# Patient Record
Sex: Female | Born: 1965 | Hispanic: No | Smoking: Current every day smoker
Health system: Southern US, Community
[De-identification: ages and names within clinical notes are randomized; demographics above are authoritative.]

## PROBLEM LIST (undated history)

## (undated) DIAGNOSIS — F419 Anxiety disorder, unspecified: Secondary | ICD-10-CM

## (undated) DIAGNOSIS — F32A Depression, unspecified: Secondary | ICD-10-CM

## (undated) DIAGNOSIS — E785 Hyperlipidemia, unspecified: Secondary | ICD-10-CM

## (undated) DIAGNOSIS — F329 Major depressive disorder, single episode, unspecified: Secondary | ICD-10-CM

## (undated) HISTORY — DX: Depression, unspecified: F32.A

## (undated) HISTORY — PX: OTHER SURGICAL HISTORY: SHX169

## (undated) HISTORY — DX: Major depressive disorder, single episode, unspecified: F32.9

## (undated) HISTORY — DX: Anxiety disorder, unspecified: F41.9

## (undated) HISTORY — PX: ABDOMINAL HYSTERECTOMY: SHX81

## (undated) HISTORY — DX: Hyperlipidemia, unspecified: E78.5

---

## 2003-05-19 ENCOUNTER — Other Ambulatory Visit: Admission: RE | Admit: 2003-05-19 | Discharge: 2003-05-19 | Payer: Self-pay | Admitting: Family Medicine

## 2005-05-11 ENCOUNTER — Other Ambulatory Visit: Admission: RE | Admit: 2005-05-11 | Discharge: 2005-05-11 | Payer: Self-pay | Admitting: Family Medicine

## 2007-05-07 ENCOUNTER — Other Ambulatory Visit: Admission: RE | Admit: 2007-05-07 | Discharge: 2007-05-07 | Payer: Self-pay | Admitting: Family Medicine

## 2009-04-13 ENCOUNTER — Encounter: Payer: Self-pay | Admitting: Cardiovascular Disease

## 2009-04-13 ENCOUNTER — Ambulatory Visit: Payer: Self-pay | Admitting: Cardiovascular Disease

## 2009-04-13 DIAGNOSIS — D72829 Elevated white blood cell count, unspecified: Secondary | ICD-10-CM | POA: Insufficient documentation

## 2009-04-13 DIAGNOSIS — R0789 Other chest pain: Secondary | ICD-10-CM

## 2009-04-13 DIAGNOSIS — F17201 Nicotine dependence, unspecified, in remission: Secondary | ICD-10-CM

## 2009-04-13 DIAGNOSIS — R55 Syncope and collapse: Secondary | ICD-10-CM | POA: Insufficient documentation

## 2012-02-06 ENCOUNTER — Other Ambulatory Visit: Payer: Self-pay | Admitting: Family Medicine

## 2012-02-06 DIAGNOSIS — N644 Mastodynia: Secondary | ICD-10-CM

## 2013-05-29 ENCOUNTER — Telehealth: Payer: Self-pay | Admitting: Family Medicine

## 2013-05-29 NOTE — Telephone Encounter (Signed)
appt given with Sarah Mcgee for Monday at 9:00

## 2013-06-02 ENCOUNTER — Ambulatory Visit (INDEPENDENT_AMBULATORY_CARE_PROVIDER_SITE_OTHER): Payer: Managed Care, Other (non HMO) | Admitting: General Practice

## 2013-06-02 ENCOUNTER — Encounter: Payer: Self-pay | Admitting: General Practice

## 2013-06-02 VITALS — BP 123/85 | HR 98 | Temp 98.7°F | Ht 65.0 in | Wt 163.0 lb

## 2013-06-02 DIAGNOSIS — L089 Local infection of the skin and subcutaneous tissue, unspecified: Secondary | ICD-10-CM

## 2013-06-02 DIAGNOSIS — B999 Unspecified infectious disease: Secondary | ICD-10-CM

## 2013-06-02 DIAGNOSIS — F329 Major depressive disorder, single episode, unspecified: Secondary | ICD-10-CM

## 2013-06-02 DIAGNOSIS — F32A Depression, unspecified: Secondary | ICD-10-CM

## 2013-06-02 DIAGNOSIS — F411 Generalized anxiety disorder: Secondary | ICD-10-CM

## 2013-06-02 MED ORDER — ALPRAZOLAM 0.25 MG PO TABS: 0.2500 mg | ORAL_TABLET | Freq: Two times a day (BID) | ORAL | Status: DC | PRN

## 2013-06-02 MED ORDER — CITALOPRAM HYDROBROMIDE 40 MG PO TABS: 40.0000 mg | ORAL_TABLET | Freq: Every day | ORAL | Status: DC

## 2013-06-02 MED ORDER — SULFAMETHOXAZOLE-TMP DS 800-160 MG PO TABS: 1.0000 | ORAL_TABLET | Freq: Two times a day (BID) | ORAL | Status: DC

## 2013-06-02 NOTE — Progress Notes (Signed)
  Subjective:    Patient ID: Sarah Mcgee, female    DOB: 11/29/1966, 47 y.o.   MRN: [id number]  HPI Presents today for medication refill. Reports she stopped taking medications on her own. Reports she has not taken in a few months. Reports she has been more irritable and having mood swings. Reports having no motivation at times.  Patient feels she needs to go back on the medications. Reports stress and anxiety is coming from work. Reports her shift was changed at work, she went from working third to second shift, which caused major adjustments. Reports having a hysterectomy in the 1990's.     Review of Systems  Constitutional: Negative for fever and chills.  Respiratory: Negative for chest tightness, shortness of breath and wheezing.   Cardiovascular: Negative for chest pain and palpitations.  Gastrointestinal: Negative for abdominal pain.  Genitourinary: Negative for difficulty urinating.  Neurological: Negative for dizziness, weakness, numbness and headaches.  Psychiatric/Behavioral: Positive for sleep disturbance. Negative for suicidal ideas. The patient is nervous/anxious.        Objective:   Physical Exam  Constitutional: She is oriented to person, place, and time. She appears well-developed and well-nourished.  HENT:  Head: Normocephalic and atraumatic.  Right Ear: External ear normal.  Left Ear: External ear normal.  Cardiovascular: Normal rate, regular rhythm and normal heart sounds.   Pulmonary/Chest: Effort normal and breath sounds normal.  Genitourinary:  Small maculopapular erythematous area (size of pencil eraser) noted to left labia majora. Negative drainage noted.  Neurological: She is alert and oriented to person, place, and time.  Skin: Skin is warm and dry.  Psychiatric: She has a normal mood and affect.          Assessment & Plan:  1. Depression - citalopram (CELEXA) 40 MG tablet; Take 1 tablet (40 mg total) by mouth daily.  Dispense: 30 tablet; Refill:  0 -RTO in 6 weeks for reassessment  2. Generalized anxiety disorder - ALPRAZolam (XANAX) 0.25 MG tablet; Take 1 tablet (0.25 mg total) by mouth 2 (two) times daily as needed for anxiety.  Dispense: 60 tablet; Refill: 0 -reduce stressors and determine areas of anxiety   3. Soft tissue infection - sulfamethoxazole-trimethoprim (BACTRIM DS) 800-160 MG per tablet; Take 1 tablet by mouth 2 (two) times daily.  Dispense: 20 tablet; Refill: 0 -keep area clean and dry -proper perineal care -RTO if symptoms worsen -Patient verbalized understanding -Coralie Keens, FNP-C

## 2013-06-02 NOTE — Patient Instructions (Addendum)
Depression, Adult Depression refers to feeling sad, low, down in the dumps, blue, gloomy, or empty. In general, there are two kinds of depression: 1. Depression that we all experience from time to time because of upsetting life experiences, including the loss of a job or the ending of a relationship (normal sadness or normal grief). This kind of depression is considered normal, is short lived, and resolves within a few days to 2 weeks. (Depression experienced after the loss of a loved one is called bereavement. Bereavement often lasts longer than 2 weeks but normally gets better with time.) 2. Clinical depression, which lasts longer than normal sadness or normal grief or interferes with your ability to function at home, at work, and in school. It also interferes with your personal relationships. It affects almost every aspect of your life. Clinical depression is an illness. Symptoms of depression also can be caused by conditions other than normal sadness and grief or clinical depression. Examples of these conditions are listed as follows:  Physical illness Some physical illnesses, including underactive thyroid gland (hypothyroidism), severe anemia, specific types of cancer, diabetes, uncontrolled seizures, heart and lung problems, strokes, and chronic pain are commonly associated with symptoms of depression.  Side effects of some prescription medicine In some people, certain types of prescription medicine can cause symptoms of depression.  Substance abuse Abuse of alcohol and illicit drugs can cause symptoms of depression. SYMPTOMS Symptoms of normal sadness and normal grief include the following:  Feeling sad or crying for short periods of time.  Not caring about anything (apathy).  Difficulty sleeping or sleeping too much.  No longer able to enjoy the things you used to enjoy.  Desire to be by oneself all the time (social isolation).  Lack of energy or motivation.  Difficulty  concentrating or remembering.  Change in appetite or weight.  Restlessness or agitation. Symptoms of clinical depression include the same symptoms of normal sadness or normal grief and also the following symptoms:  Feeling sad or crying all the time.  Feelings of guilt or worthlessness.  Feelings of hopelessness or helplessness.  Thoughts of suicide or the desire to harm yourself (suicidal ideation).  Loss of touch with reality (psychotic symptoms). Seeing or hearing things that are not real (hallucinations) or having false beliefs about your life or the people around you (delusions and paranoia). DIAGNOSIS  The diagnosis of clinical depression usually is based on the severity and duration of the symptoms. Your caregiver also will ask you questions about your medical history and substance use to find out if physical illness, use of prescription medicine, or substance abuse is causing your depression. Your caregiver also may order blood tests. TREATMENT  Typically, normal sadness and normal grief do not require treatment. However, sometimes antidepressant medicine is prescribed for bereavement to ease the depressive symptoms until they resolve. The treatment for clinical depression depends on the severity of your symptoms but typically includes antidepressant medicine, counseling with a mental health professional, or a combination of both. Your caregiver will help to determine what treatment is best for you. Depression caused by physical illness usually goes away with appropriate medical treatment of the illness. If prescription medicine is causing depression, talk with your caregiver about stopping the medicine, decreasing the dose, or substituting another medicine. Depression caused by abuse of alcohol or illicit drugs abuse goes away with abstinence from these substances. Some adults need professional help in order to stop drinking or using drugs. SEEK IMMEDIATE CARE IF:  You have   thoughts  about hurting yourself or others.  You lose touch with reality (have psychotic symptoms).  You are taking medicine for depression and have a serious side effect. FOR MORE INFORMATION National Alliance on Mental Illness: www.nami.[employer]: http://www.maynard.net/ Document Released: 12/08/2000 Document Revised: 06/11/2012 Document Reviewed: 03/11/2012 [facility] Patient Information 2014 [place], [place]. Anxiety and Panic Attacks Your caregiver has informed you that you are having an anxiety or panic attack. There may be many forms of this. Most of the time these attacks come suddenly and without warning. They come at any time of day, including periods of sleep, and at any time of life. They may be strong and unexplained. Although panic attacks are very scary, they are physically harmless. Sometimes the cause of your anxiety is not known. Anxiety is a protective mechanism of the body in its fight or flight mechanism. Most of these perceived danger situations are actually nonphysical situations (such as anxiety over losing a job). CAUSES  The causes of an anxiety or panic attack are many. Panic attacks may occur in otherwise healthy people given a certain set of circumstances. There may be a genetic cause for panic attacks. Some medications may also have anxiety as a side effect. SYMPTOMS  Some of the most common feelings are:  Intense terror.  Dizziness, feeling faint.  Hot and cold flashes.  Fear of going crazy.  Feelings that nothing is real.  Sweating.  Shaking.  Chest pain or a fast heartbeat (palpitations).  Smothering, choking sensations.  Feelings of impending doom and that death is near.  Tingling of extremities, this may be from over-breathing.  Altered reality (derealization).  Being detached from yourself (depersonalization). Several symptoms can be present to make up anxiety or panic attacks. DIAGNOSIS  The evaluation by your caregiver will  depend on the type of symptoms you are experiencing. The diagnosis of anxiety or panic attack is made when no physical illness can be determined to be a cause of the symptoms. TREATMENT  Treatment to prevent anxiety and panic attacks may include:  Avoidance of circumstances that cause anxiety.  Reassurance and relaxation.  Regular exercise.  Relaxation therapies, such as yoga.  Psychotherapy with a psychiatrist or therapist.  Avoidance of caffeine, alcohol and illegal drugs.  Prescribed medication. SEEK IMMEDIATE MEDICAL CARE IF:   You experience panic attack symptoms that are different than your usual symptoms.  You have any worsening or concerning symptoms. Document Released: 12/11/2005 Document Revised: 0 Skin Infections A skin infection usually develops as a result of disruption of the skin barrier.  CAUSES  A skin infection might occur following:  Trauma or an injury to the skin such as a cut or insect sting.  Inflammation (as in eczema).  Breaks in the skin between the toes (as in athlete's foot).  Swelling (edema). SYMPTOMS  The legs are the most common site affected. Usually there is:  Redness.  Swelling.  Pain.  There may be red streaks in the area of the infection. TREATMENT   Minor skin infections may be treated with topical antibiotics, but if the skin infection is severe, hospital care and intravenous (IV) antibiotic treatment may be needed.  Most often skin infections can be treated with oral antibiotic medicine as well as proper rest and elevation of the affected area until the infection improves.  If you are prescribed oral antibiotics, it is important to take them as directed and to take all the pills even if you feel better before you  have finished all of the medicine.  You may apply warm compresses to the area for 20-30 minutes 4 times daily. You might need a tetanus shot now if:  You have no idea when you had the last one.  You have never  had a tetanus shot before.  Your wound had dirt in it. If you need a tetanus shot and you decide not to get one, there is a rare chance of getting tetanus. Sickness from tetanus can be serious. If you get a tetanus shot, your arm may swell and become red and warm at the shot site. This is common and not a problem. SEEK MEDICAL CARE IF:  The pain and swelling from your infection do not improve within 2 days.  SEEK IMMEDIATE MEDICAL CARE IF:  You develop a fever, chills, or other serious problems.  Document Released: 01/18/2005 Document Revised: 03/04/2012 Document Reviewed: 11/30/2008 [facility] Patient Information 2014 [place], [place]. 03/04/2012 Document Reviewed: 04/14/2010 [employer] Patient Information 2014 [place], [place].

## 2013-06-26 ENCOUNTER — Encounter: Payer: Self-pay | Admitting: General Practice

## 2013-06-26 ENCOUNTER — Ambulatory Visit (INDEPENDENT_AMBULATORY_CARE_PROVIDER_SITE_OTHER): Payer: Managed Care, Other (non HMO) | Admitting: General Practice

## 2013-06-26 VITALS — BP 107/75 | HR 90 | Temp 97.8°F | Ht 65.0 in | Wt 152.0 lb

## 2013-06-26 DIAGNOSIS — Z0000 Encounter for general adult medical examination without abnormal findings: Secondary | ICD-10-CM

## 2013-06-26 DIAGNOSIS — Z124 Encounter for screening for malignant neoplasm of cervix: Secondary | ICD-10-CM

## 2013-06-26 DIAGNOSIS — F329 Major depressive disorder, single episode, unspecified: Secondary | ICD-10-CM

## 2013-06-26 DIAGNOSIS — F3289 Other specified depressive episodes: Secondary | ICD-10-CM

## 2013-06-26 DIAGNOSIS — F411 Generalized anxiety disorder: Secondary | ICD-10-CM

## 2013-06-26 DIAGNOSIS — F32A Depression, unspecified: Secondary | ICD-10-CM

## 2013-06-26 LAB — POCT CBC
Granulocyte percent: 71.9 %G (ref 37–80)
Lymph, poc: 3.2 (ref 0.6–3.4)
MPV: 8.6 fL (ref 0–99.8)
POC Granulocyte: 9.2 — AB (ref 2–6.9)
POC LYMPH PERCENT: 24.7 %L (ref 10–50)
Platelet Count, POC: 322 10*3/uL (ref 142–424)
RBC: 5.5 M/uL — AB (ref 4.04–5.48)

## 2013-06-26 LAB — POCT URINALYSIS DIPSTICK
Glucose, UA: NEGATIVE
Leukocytes, UA: NEGATIVE
Nitrite, UA: NEGATIVE
Urobilinogen, UA: NEGATIVE

## 2013-06-26 LAB — POCT UA - MICROSCOPIC ONLY: WBC, Ur, HPF, POC: NEGATIVE

## 2013-06-26 MED ORDER — CITALOPRAM HYDROBROMIDE 40 MG PO TABS: 40.0000 mg | ORAL_TABLET | Freq: Every day | ORAL | Status: DC

## 2013-06-26 MED ORDER — ALPRAZOLAM 0.5 MG PO TABS: 0.5000 mg | ORAL_TABLET | Freq: Two times a day (BID) | ORAL | Status: DC | PRN

## 2013-06-26 NOTE — Progress Notes (Signed)
  Subjective:    Patient ID: Sarah Mcgee, female    DOB: September 27, 1966, 47 y.o.   MRN: [id number]  HPI Presents today for annual physical. She is has a history of depression and anxiety. She reports medications are effective, but seems more anxious since xanax decreased. She reports eating a healthy and regular exercise. She weight loss with walking, decrease carbs, no alcohol. She also reports smoking cessation for past month.     Review of Systems  Constitutional: Negative for fever and chills.  Respiratory: Negative for chest tightness and shortness of breath.   Cardiovascular: Negative for chest pain and palpitations.  Gastrointestinal: Negative for vomiting, abdominal pain, diarrhea, constipation and blood in stool.  Genitourinary: Negative for hematuria, vaginal bleeding and difficulty urinating.  Musculoskeletal: Negative for back pain.  Neurological: Negative for dizziness, weakness and headaches.       Objective:   Physical Exam  Constitutional: She is oriented to person, place, and time. She appears well-developed and well-nourished.  HENT:  Head: Normocephalic and atraumatic.  Right Ear: External ear normal.  Left Ear: External ear normal.  Mouth/Throat: Oropharynx is clear and moist.  Eyes: EOM are normal.  Neck: Normal range of motion. Neck supple. No thyromegaly present.  Cardiovascular: Normal rate, regular rhythm and normal heart sounds.   Pulmonary/Chest: Effort normal and breath sounds normal. Right breast exhibits no inverted nipple, no mass, no nipple discharge, no skin change and no tenderness. Left breast exhibits no inverted nipple, no mass, no nipple discharge, no skin change and no tenderness. Breasts are symmetrical.  Abdominal: Soft. Bowel sounds are normal. She exhibits no distension. There is no tenderness. Hernia confirmed negative in the right inguinal area and confirmed negative in the left inguinal area.  Genitourinary: No breast swelling, tenderness,  discharge or bleeding. Pelvic exam was performed with patient supine. There is no rash, tenderness, lesion or injury on the right labia. There is no rash, tenderness, lesion or injury on the left labia. Right adnexum displays no mass, no tenderness and no fullness. Left adnexum displays no mass, no tenderness and no fullness. No erythema, tenderness or bleeding around the vagina. No signs of injury around the vagina. No vaginal discharge found.  Vaginal cuff intact, hysterectomy noted  Neurological: She is alert and oriented to person, place, and time.  Skin: Skin is warm and dry.  Psychiatric: She has a normal mood and affect.          Assessment & Plan:  1. PE (physical exam), annual - POCT urinalysis dipstick - POCT UA - Microscopic Only - POCT CBC - COMPLETE METABOLIC PANEL WITH GFR - Pap IG, CT/NG w/ reflex HPV when ASC-U  2. Generalized anxiety disorder - ALPRAZolam (XANAX) 0.5 MG tablet; Take 1 tablet (0.5 mg total) by mouth 2 (two) times daily as needed for anxiety.  Dispense: 60 tablet; Refill: 0  3. Depression - citalopram (CELEXA) 40 MG tablet; Take 1 tablet (40 mg total) by mouth daily.  Dispense: 30 tablet; Refill: 3 Continue all current medications Labs pending F/u in 3 months or sooner if symptoms develop Continue regular exercise and healthy eating habits  Continue smoking cessation Patient verbalized understanding Coralie Keens, FNP-C

## 2013-06-26 NOTE — Patient Instructions (Addendum)

## 2013-06-27 LAB — COMPLETE METABOLIC PANEL WITH GFR
ALT: 27 U/L (ref 0–35)
AST: 23 U/L (ref 0–37)
Albumin: 4.4 g/dL (ref 3.5–5.2)
Alkaline Phosphatase: 52 U/L (ref 39–117)
Calcium: 9.9 mg/dL (ref 8.4–10.5)
Chloride: 101 mEq/L (ref 96–112)
Potassium: 4.4 mEq/L (ref 3.5–5.3)
Sodium: 137 mEq/L (ref 135–145)

## 2013-07-01 LAB — PAP IG, CT-NG, RFX HPV ASCU
Chlamydia Probe Amp: NEGATIVE
GC Probe Amp: NEGATIVE

## 2013-08-01 ENCOUNTER — Ambulatory Visit (INDEPENDENT_AMBULATORY_CARE_PROVIDER_SITE_OTHER): Payer: Managed Care, Other (non HMO) | Admitting: Family Medicine

## 2013-08-01 ENCOUNTER — Encounter: Payer: Self-pay | Admitting: Family Medicine

## 2013-08-01 VITALS — BP 115/74 | HR 86 | Temp 98.1°F | Ht 65.0 in | Wt 158.0 lb

## 2013-08-01 DIAGNOSIS — R1011 Right upper quadrant pain: Secondary | ICD-10-CM

## 2013-08-01 LAB — POCT CBC
Granulocyte percent: 78.9 %G (ref 37–80)
HCT, POC: 46.3 % (ref 37.7–47.9)
Hemoglobin: 15.7 g/dL (ref 12.2–16.2)
Lymph, poc: 3 (ref 0.6–3.4)
MCH, POC: 30.9 pg (ref 27–31.2)
MCHC: 34 g/dL (ref 31.8–35.4)
MCV: 90.8 fL (ref 80–97)
MPV: 7.9 fL (ref 0–99.8)
POC Granulocyte: 13.4 — AB (ref 2–6.9)
POC LYMPH PERCENT: 17.9 %L (ref 10–50)
Platelet Count, POC: 290 10*3/uL (ref 142–424)
RBC: 5.1 M/uL (ref 4.04–5.48)
RDW, POC: 15 %
WBC: 17 10*3/uL — AB (ref 4.6–10.2)

## 2013-08-01 NOTE — Progress Notes (Signed)
Subjective:    Patient ID: Sarah Mcgee, female    DOB: Sep 28, 1966, 47 y.o.   MRN: [id number]  HPI This 47 y.o. female presents for evaluation of abdominal pain for the last few days. She was seen on 07/30/13 at [facility] ED in [place] and underwent CT of abdomen and  Labs and was dx with colitis.  She was administered iv pain meds and given a rx of lortab #15 And she received zofran and IV fluids.  She states she was told her CT was negative and she  Had acute gastroenteritis.  She has been having colicky right upper quadrant abdominal pain that radiates To her back for the last few weeks and it is worsening.  She states she is having fever but is not taking Her temperature.  She states when she eats she becomes nauseated and has the abdominal pain. She has been nauseated and has been experiencing some diarrhea.  She also is c/o right otalgia and decreased Hearing.   Review of Systems C/o right upper quadrant abdominal pain, right back pain, nausea, diarrhea, fever, and malaise. C/o right otalgia   No chest pain, SOB, HA, dizziness, vision change,  constipation, dysuria, urinary urgency or frequency, myalgias, arthralgias or rash.  Objective:   Physical Exam  Vital signs noted  Well developed well nourished female in pain.  HEENT - Head atraumatic Normocephalic                Eyes - PERRLA, Conjuctiva - clear Sclera- Clear EOMI                Ears - EAC's Wnl TM's AD dull and injected and AS wnl Gross Hearing decreased right ear.                Nose - Nares patent                 Throat - oropharanx wnl Respiratory - Lungs CTA bilateral Cardiac - RRR S1 and S2 without murmur GI - Abdomen tender right upper quadrant, epigastric region, and positive murphy's,  bowel sounds active x 4 Extremities - No edema. Neuro - Grossly intact.  Results for orders placed in visit on 08/01/13  POCT CBC      Result Value Range   WBC 17.0 (*) 4.6 - 10.2 K/uL   Lymph, poc 3.0  0.6 - 3.4   POC LYMPH PERCENT 17.9  10 - 50 %L   MID (cbc)    0 - 0.9   POC MID %    0 - 12 %M   POC Granulocyte 13.4 (*) 2 - 6.9   Granulocyte percent 78.9  37 - 80 %G   RBC 5.1  4.04 - 5.48 M/uL   Hemoglobin 15.7  12.2 - 16.2 g/dL   HCT, POC 40.9  81.1 - 47.9 %   MCV 90.8  80 - 97 fL   MCH, POC 30.9  27 - 31.2 pg   MCHC 34.0  31.8 - 35.4 g/dL   RDW, POC 91.4     Platelet Count, POC 290.0  142 - 424 K/uL   MPV 7.9  0 - 99.8 fL      Assessment & Plan:  Abdominal pain, right upper quadrant - Plan: POCT CBC, Sedimentation rate, Lipase, Amylase, CMP14+EGFR, US Abdomen Limited RUQ Her wbc count was 17 and has increased since her visit 07/30/13 where it was 15.7 so patient was advised that she should go on to the Emergency room and  be seen for acute abdomen.  She wants to go back to [facility] ED and they are called and given history.

## 2013-08-01 NOTE — Patient Instructions (Addendum)

## 2013-08-03 LAB — CMP14+EGFR
ALT: 20 IU/L (ref 0–32)
AST: 24 IU/L (ref 0–40)
Albumin/Globulin Ratio: 2.1 (ref 1.1–2.5)
Albumin: 4.1 g/dL (ref 3.5–5.5)
Alkaline Phosphatase: 64 IU/L (ref 39–117)
BUN/Creatinine Ratio: 16 (ref 9–23)
BUN: 9 mg/dL (ref 6–24)
CO2: 23 mmol/L (ref 18–29)
Calcium: 9 mg/dL (ref 8.7–10.2)
Chloride: 100 mmol/L (ref 97–108)
Creatinine, Ser: 0.56 mg/dL — ABNORMAL LOW (ref 0.57–1.00)
GFR calc Af Amer: 128 mL/min/{1.73_m2} (ref >59)
GFR calc non Af Amer: 111 mL/min/{1.73_m2} (ref >59)
Globulin, Total: 2 g/dL (ref 1.5–4.5)
Glucose: 73 mg/dL (ref 65–99)
Potassium: 4.3 mmol/L (ref 3.5–5.2)
Sodium: 139 mmol/L (ref 134–144)
Total Bilirubin: 0.5 mg/dL (ref 0.0–1.2)
Total Protein: 6.1 g/dL (ref 6.0–8.5)

## 2013-08-03 LAB — AMYLASE: Amylase: 39 U/L (ref 31–124)

## 2013-08-03 LAB — SEDIMENTATION RATE: Sed Rate: 2 mm/hr (ref 0–32)

## 2013-08-03 LAB — LIPASE: Lipase: 13 U/L (ref 0–59)

## 2013-08-04 ENCOUNTER — Encounter: Payer: Self-pay | Admitting: *Deleted

## 2013-08-04 ENCOUNTER — Telehealth: Payer: Self-pay | Admitting: General Practice

## 2013-08-04 DIAGNOSIS — R1011 Right upper quadrant pain: Secondary | ICD-10-CM

## 2013-08-05 ENCOUNTER — Telehealth: Payer: Self-pay | Admitting: Family Medicine

## 2013-08-05 NOTE — Telephone Encounter (Signed)
Spoke with patient on 08/04/13 and she did not go to the hospital as directed. She continues to have significant pain.  It has not worsened though. Told patient that we would go ahead with the [place] and get it scheduled today.  She should go to the ED if her pain worsens or she develops more symptoms. Patient stated understanding and agreement.

## 2013-08-06 ENCOUNTER — Telehealth: Payer: Self-pay | Admitting: Family Medicine

## 2013-08-06 NOTE — Telephone Encounter (Signed)
Work note was faxed yesterday.

## 2013-08-06 NOTE — Telephone Encounter (Signed)
Not was faxed to requested number yesterday.

## 2013-08-07 ENCOUNTER — Ambulatory Visit (INDEPENDENT_AMBULATORY_CARE_PROVIDER_SITE_OTHER): Payer: Managed Care, Other (non HMO) | Admitting: Family Medicine

## 2013-08-07 ENCOUNTER — Encounter: Payer: Self-pay | Admitting: Family Medicine

## 2013-08-07 VITALS — BP 109/70 | HR 89 | Temp 97.6°F | Ht 65.0 in | Wt 152.0 lb

## 2013-08-07 DIAGNOSIS — M549 Dorsalgia, unspecified: Secondary | ICD-10-CM

## 2013-08-07 DIAGNOSIS — D72829 Elevated white blood cell count, unspecified: Secondary | ICD-10-CM

## 2013-08-07 DIAGNOSIS — R1011 Right upper quadrant pain: Secondary | ICD-10-CM

## 2013-08-07 DIAGNOSIS — R110 Nausea: Secondary | ICD-10-CM

## 2013-08-07 LAB — POCT URINALYSIS DIPSTICK
Bilirubin, UA: NEGATIVE
Blood, UA: NEGATIVE
Glucose, UA: NEGATIVE
Ketones, UA: NEGATIVE
Leukocytes, UA: NEGATIVE
Nitrite, UA: NEGATIVE
Protein, UA: NEGATIVE
Spec Grav, UA: 1.01
Urobilinogen, UA: NEGATIVE
pH, UA: 6

## 2013-08-07 LAB — POCT UA - MICROSCOPIC ONLY
Casts, Ur, LPF, POC: NEGATIVE
Crystals, Ur, HPF, POC: NEGATIVE
Yeast, UA: NEGATIVE

## 2013-08-07 LAB — POCT CBC
Granulocyte percent: 73.6 %G (ref 37–80)
HCT, POC: 50.9 % — AB (ref 37.7–47.9)
Hemoglobin: 16.8 g/dL — AB (ref 12.2–16.2)
Lymph, poc: 2.9 (ref 0.6–3.4)
MCH, POC: 30 pg (ref 27–31.2)
MCHC: 33 g/dL (ref 31.8–35.4)
MCV: 91 fL (ref 80–97)
MPV: 8.2 fL (ref 0–99.8)
POC Granulocyte: 9.7 — AB (ref 2–6.9)
POC LYMPH PERCENT: 22.3 %L (ref 10–50)
Platelet Count, POC: 329 10*3/uL (ref 142–424)
RBC: 5.6 M/uL — AB (ref 4.04–5.48)
RDW, POC: 15.5 %
WBC: 13.2 10*3/uL — AB (ref 4.6–10.2)

## 2013-08-07 MED ORDER — HYDROCODONE-ACETAMINOPHEN 5-325 MG PO TABS: 1.0000 | ORAL_TABLET | Freq: Four times a day (QID) | ORAL | Status: DC | PRN

## 2013-08-07 NOTE — Progress Notes (Signed)
Subjective:    Patient ID: Sarah Mcgee, female    DOB: [birth year], 47 y.o.   MRN: [id number]  HPI This 47 y.o. female presents for evaluation of abdominal pain.  Patient was seen last week for Acute abdominal pain.  She was first seen at the ED and underwent CT of abdomen and pelvis with Contrast and it did not show abnormalities.  She had elevated wbc count of 17 which was elevated from The ED of 15 so she was told to go to the ED for acute abdominal pain and the ED was called and the cbc Was faxed to the ED.  She did not go to the ED and a GB US was performed 2 days ago and the results were  Normal.  She was again urged to go to the ED but she would rather follow up here.  She has right back pain which Is radiating to her right upper quadrant.  She states her urine is dark. She has not had kidney stones in the past. She is a smoker.  She states she feels bad.  She denies fever.  She c/o congestion and ear discomfort and wants Her ears to be checked.   Review of Systems C/o back and abdominal pain. C/o fatigue.  C/o ear discomfort.   No chest pain, SOB, HA, dizziness, vision change, N/V, diarrhea, constipation, dysuria, urinary urgency or frequency, myalgias, arthralgias or rash.  Objective:   Physical Exam Vital signs noted  Well developed well nourished female in NAD.  HEENT - Head atraumatic Normocephalic                Eyes - PERRLA, Conjuctiva - clear Sclera- Clear EOMI                Ears - EAC's Wnl TM's Wnl Gross Hearing WNL                Nose - Nares patent                 Throat - oropharanx wnl Respiratory - Lungs CTA bilateral Cardiac - RRR S1 and S2 without murmur GI - Abdomen soft, tender right upper quadrant without guarding, and bowel sounds active x 4. Negative Murphy's. MS - Tender right lateral and posterior costal  Extremities - No edema. Neuro - Grossly intact.  Results for orders placed in visit on 08/07/13  POCT CBC      Result Value Range   WBC  13.2 (*) 4.6 - 10.2 K/uL   Lymph, poc 2.9  0.6 - 3.4   POC LYMPH PERCENT 22.3  10 - 50 %L   POC Granulocyte 9.7 (*) 2 - 6.9   Granulocyte percent 73.6  37 - 80 %G   RBC 5.6 (*) 4.04 - 5.48 M/uL   Hemoglobin 16.8 (*) 12.2 - 16.2 g/dL   HCT, POC 78.2 (*) 95.6 - 47.9 %   MCV 91.0  80 - 97 fL   MCH, POC 30.0  27 - 31.2 pg   MCHC 33.0  31.8 - 35.4 g/dL   RDW, POC 21.3     Platelet Count, POC 329.0  142 - 424 K/uL   MPV 8.2  0 - 99.8 fL  POCT URINALYSIS DIPSTICK      Result Value Range   Color, UA yellow     Clarity, UA clear     Glucose, UA neg     Bilirubin, UA neg     Ketones, UA neg  Spec Grav, UA 1.010     Blood, UA neg     pH, UA 6.0     Protein, UA neg     Urobilinogen, UA negative     Nitrite, UA neg     Leukocytes, UA Negative    POCT UA - MICROSCOPIC ONLY      Result Value Range   WBC, Ur, HPF, POC occ     RBC, urine, microscopic occ     Bacteria, U Microscopic mod     Mucus, UA trace     Epithelial cells, urine per micros few     Crystals, Ur, HPF, POC neg     Casts, Ur, LPF, POC neg     Yeast, UA neg     Assessment & Plan:  Elevated WBC count - Plan: POCT CBC, POCT urinalysis dipstick, POCT UA - Microscopic Only. Her wbc count is 13.2 and this is down from 15 when she was seen in the ED.  This may be her baseline She is not having any febrile illness or infection source.  Doubt cholycystitis etiology.  Discussed with Patient she may have leukocytosis and will need to repeat her cbc in a month.  Discussed DC smoking.  Back pain - UA negative for blood or UTI so probably due to either costal pain or possible hepatabiliary Etiology.  Refill Lortab w/o refill an must f/u for refill.  Abdominal pain, right upper quadrant - WBC's are trending down, order hepatabiliary scan.  If shows GB EF below 30% will refer to surgery.  If normal then will refer to GI.  Refill lortab.  Must f/u for refill.  Nausea alone - Zofran prn  Rhinitis - Dymista Nasal spray one spray  per nostril bid.  Discussed stop smoking.

## 2013-08-07 NOTE — Patient Instructions (Signed)

## 2013-08-08 NOTE — Telephone Encounter (Signed)
Pt was contacted by provider

## 2013-08-12 ENCOUNTER — Telehealth: Payer: Self-pay | Admitting: Family Medicine

## 2013-08-15 ENCOUNTER — Encounter: Payer: Self-pay | Admitting: Family Medicine

## 2013-08-15 ENCOUNTER — Ambulatory Visit (INDEPENDENT_AMBULATORY_CARE_PROVIDER_SITE_OTHER): Payer: Managed Care, Other (non HMO) | Admitting: Family Medicine

## 2013-08-15 ENCOUNTER — Telehealth: Payer: Self-pay | Admitting: Family Medicine

## 2013-08-15 VITALS — BP 132/88 | HR 96 | Temp 97.7°F | Ht 65.0 in | Wt 150.4 lb

## 2013-08-15 DIAGNOSIS — R197 Diarrhea, unspecified: Secondary | ICD-10-CM

## 2013-08-15 DIAGNOSIS — F329 Major depressive disorder, single episode, unspecified: Secondary | ICD-10-CM

## 2013-08-15 DIAGNOSIS — F411 Generalized anxiety disorder: Secondary | ICD-10-CM

## 2013-08-15 DIAGNOSIS — R109 Unspecified abdominal pain: Secondary | ICD-10-CM

## 2013-08-15 MED ORDER — CHOLESTYRAMINE LIGHT 4 G PO PACK: 4.0000 g | PACK | Freq: Two times a day (BID) | ORAL | Status: DC

## 2013-08-15 MED ORDER — CITALOPRAM HYDROBROMIDE 40 MG PO TABS: 40.0000 mg | ORAL_TABLET | Freq: Every day | ORAL | Status: DC

## 2013-08-15 MED ORDER — ALPRAZOLAM 0.5 MG PO TABS: 0.5000 mg | ORAL_TABLET | Freq: Two times a day (BID) | ORAL | Status: DC | PRN

## 2013-08-15 NOTE — Telephone Encounter (Signed)
This encounter was created in error - please disregard.

## 2013-08-15 NOTE — Telephone Encounter (Signed)
Patient aware.

## 2013-08-15 NOTE — Patient Instructions (Signed)
Diarrhea Diarrhea is frequent loose and watery bowel movements. It can cause you to feel weak and dehydrated. Dehydration can cause you to become tired and thirsty, have a dry mouth, and have decreased urination that often is dark yellow. Diarrhea is a sign of another problem, most often an infection that will not last long. In most cases, diarrhea typically lasts 2 3 days. However, it can last longer if it is a sign of something more serious. It is important to treat your diarrhea as directed by your caregive to lessen or prevent future episodes of diarrhea. CAUSES  Some common causes include:  Gastrointestinal infections caused by viruses, bacteria, or parasites.  Food poisoning or food allergies.  Certain medicines, such as antibiotics, chemotherapy, and laxatives.  Artificial sweeteners and fructose.  Digestive disorders. HOME CARE INSTRUCTIONS  Ensure adequate fluid intake (hydration): have 1 cup (8 oz) of fluid for each diarrhea episode. Avoid fluids that contain simple sugars or sports drinks, fruit juices, whole milk products, and sodas. Your urine should be clear or pale yellow if you are drinking enough fluids. Hydrate with an oral rehydration solution that you can purchase at pharmacies, retail stores, and online. You can prepare an oral rehydration solution at home by mixing the following ingredients together:    tsp table salt.   tsp baking soda.   tsp salt substitute containing potassium chloride.  1  tablespoons sugar.  1 L (34 oz) of water.  Certain foods and beverages may increase the speed at which food moves through the gastrointestinal (GI) tract. These foods and beverages should be avoided and include:  Caffeinated and alcoholic beverages.  High-fiber foods, such as raw fruits and vegetables, nuts, seeds, and whole grain breads and cereals.  Foods and beverages sweetened with sugar alcohols, such as xylitol, sorbitol, and mannitol.  Some foods may be well  tolerated and may help thicken stool including:  Starchy foods, such as rice, toast, pasta, low-sugar cereal, oatmeal, grits, baked potatoes, crackers, and bagels.  Bananas.  Applesauce.  Add probiotic-rich foods to help increase healthy bacteria in the GI tract, such as yogurt and fermented milk products.  Wash your hands well after each diarrhea episode.  Only take over-the-counter or prescription medicines as directed by your caregiver.  Take a warm bath to relieve any burning or pain from frequent diarrhea episodes. SEEK IMMEDIATE MEDICAL CARE IF:   You are unable to keep fluids down.  You have persistent vomiting.  You have blood in your stool, or your stools are black and tarry.  You do not urinate in 6 8 hours, or there is only a small amount of very dark urine.  You have abdominal pain that increases or localizes.  You have weakness, dizziness, confusion, or lightheadedness.  You have a severe headache.  Your diarrhea gets worse or does not get better.  You have a fever or persistent symptoms for more than 2 3 days.  You have a fever and your symptoms suddenly get worse. MAKE SURE YOU:   Understand these instructions.  Will watch your condition.  Will get help right away if you are not doing well or get worse. Document Released: 12/01/2002 Document Revised: 11/27/2012 Document Reviewed: 08/18/2012 Exit[employer] Patient Information 2014 [employer].  

## 2013-08-15 NOTE — Progress Notes (Signed)
  Subjective:    Patient ID: Sarah Mcgee, female    DOB: [birth year], 47 y.o.   MRN: [id number]  HPI This 47 y.o. female presents for evaluation of abdominal pain.  She was seen at the ED at [facility] On 07/30/13.  She had a CT which was normal.  She had an elevated wbc of 17.  She has hx of leukocytosis. She followed up and her wbc trended down to 15.  Her baseline wbc was 13.  She was having nauea and dirrhea. She had an US of the GB which was normal.  She had hida scan a few days ago and it was normal.  She  C/o dirrhea and abdominal pain.  She is frustrated with having so many Bm's.   Review of Systems C/o abdominal pain and diarrhea No chest pain, SOB, HA, dizziness, vision change, N/V, diarrhea, constipation, dysuria, urinary urgency or frequency, myalgias, arthralgias or rash.     Objective:   Physical Exam Vital signs noted  Well developed well nourished female in NAD.  HEENT - Head atraumatic Normocephalic                Eyes - PERRLA, Conjuctiva - clear Sclera- Clear.                Throat - oropharanx wnl Respiratory - Lungs CTA bilateral Cardiac - RRR S1 and S2 without murmur GI - Abdomen soft Nontender and bowel sounds active x 4 Extremities - No edema. Neuro - Grossly intact.       Assessment & Plan:  Generalized anxiety disorder - Plan: ALPRAZolam (XANAX) 0.5 MG tablet  Depression - Plan: citalopram (CELEXA) 40 MG tablet  Diarrhea - Plan: cholestyramine light (PREVALITE) 4 G packet bid  Abdominal  pain, other specified site - Plan: Ambulatory referral to Gastroenterology

## 2013-08-15 NOTE — Telephone Encounter (Signed)
Very upset she needs a note stating that she was out of work from aug 6th till today please send on to [phone]. She has called three times

## 2013-08-15 NOTE — Telephone Encounter (Signed)
Filled out note and need to fax

## 2013-08-15 NOTE — Telephone Encounter (Signed)
Called back and need it till Monday because she is coming in today told her if bill need to change it he would patient aware

## 2013-09-19 ENCOUNTER — Encounter: Payer: Self-pay | Admitting: Family Medicine

## 2013-09-26 ENCOUNTER — Ambulatory Visit: Payer: Managed Care, Other (non HMO) | Admitting: General Practice

## 2013-12-26 ENCOUNTER — Ambulatory Visit (INDEPENDENT_AMBULATORY_CARE_PROVIDER_SITE_OTHER): Payer: Managed Care, Other (non HMO) | Admitting: Family Medicine

## 2013-12-26 ENCOUNTER — Encounter: Payer: Self-pay | Admitting: Family Medicine

## 2013-12-26 VITALS — BP 124/74 | HR 96 | Temp 97.2°F | Ht 65.0 in | Wt 165.0 lb

## 2013-12-26 DIAGNOSIS — R110 Nausea: Secondary | ICD-10-CM

## 2013-12-26 DIAGNOSIS — F3289 Other specified depressive episodes: Secondary | ICD-10-CM

## 2013-12-26 DIAGNOSIS — J029 Acute pharyngitis, unspecified: Secondary | ICD-10-CM

## 2013-12-26 DIAGNOSIS — J209 Acute bronchitis, unspecified: Secondary | ICD-10-CM

## 2013-12-26 DIAGNOSIS — F411 Generalized anxiety disorder: Secondary | ICD-10-CM

## 2013-12-26 DIAGNOSIS — F32A Depression, unspecified: Secondary | ICD-10-CM

## 2013-12-26 DIAGNOSIS — F329 Major depressive disorder, single episode, unspecified: Secondary | ICD-10-CM

## 2013-12-26 LAB — POCT INFLUENZA A/B
Influenza A, POC: NEGATIVE
Influenza B, POC: NEGATIVE

## 2013-12-26 LAB — POCT RAPID STREP A (OFFICE): Rapid Strep A Screen: NEGATIVE

## 2013-12-26 MED ORDER — ALPRAZOLAM 0.5 MG PO TABS: 0.5000 mg | ORAL_TABLET | Freq: Two times a day (BID) | ORAL | Status: DC | PRN

## 2013-12-26 MED ORDER — CITALOPRAM HYDROBROMIDE 40 MG PO TABS: 40.0000 mg | ORAL_TABLET | Freq: Every day | ORAL | Status: DC

## 2013-12-26 MED ORDER — ONDANSETRON 4 MG PO TBDP: 4.0000 mg | ORAL_TABLET | Freq: Three times a day (TID) | ORAL | Status: DC | PRN

## 2013-12-26 MED ORDER — BENZONATATE 100 MG PO CAPS: 100.0000 mg | ORAL_CAPSULE | Freq: Three times a day (TID) | ORAL | Status: DC | PRN

## 2013-12-26 MED ORDER — AZITHROMYCIN 250 MG PO TABS: ORAL_TABLET | ORAL | Status: DC

## 2013-12-26 NOTE — Progress Notes (Signed)
   Subjective:    Patient ID: Sarah Mcgee, female    DOB: 1966/11/01, 48 y.o.   MRN: [id number]  HPI This 48 y.o. female presents for evaluation of uri sx's and follow up on depression.  She is doing Better on the celexa and xanax.  She needs refills.  She has seen GI for her abdominal discomfort And nausea and was told she was fine and tests were wnl according to patient.  She states she Gets nauseated on occasion.   Review of Systems C/o uri sx's and nausea No chest pain, SOB, HA, dizziness, vision change, diarrhea, constipation, dysuria, urinary urgency or frequency, myalgias, arthralgias or rash.     Objective:   Physical Exam Vital signs noted  Well developed well nourished female.  HEENT - Head atraumatic Normocephalic                Eyes - PERRLA, Conjuctiva - clear Sclera- Clear EOMI                Ears - EAC's Wnl TM's Wnl Gross Hearing WNL                Nose - Nares patent                 Throat - oropharanx wnl Respiratory - Lungs CTA bilateral Cardiac - RRR S1 and S2 without murmur GI - Abdomen soft Nontender and bowel sounds active x 4 Extremities - No edema. Neuro - Grossly intact.  Results for orders placed in visit on 12/26/13  POCT RAPID STREP A (OFFICE)      Result Value Range   Rapid Strep A Screen Negative  Negative  POCT INFLUENZA A/B      Result Value Range   Influenza A, POC Negative     Influenza B, POC Negative         Assessment & Plan:  Acute pharyngitis - Plan: POCT rapid strep A, POCT Influenza A/B, azithromycin (ZITHROMAX) 250 MG tablet, benzonatate (TESSALON PERLES) 100 MG capsule  Acute bronchitis - Plan: azithromycin (ZITHROMAX) 250 MG tablet, benzonatate (TESSALON PERLES) 100 MG capsule  Generalized anxiety disorder - Plan: ALPRAZolam (XANAX) 0.5 MG tablet  Depression - Plan: citalopram (CELEXA) 40 MG tablet  Nausea - Plan: ondansetron (ZOFRAN-ODT) 4 MG disintegrating tablet  Deatra CanterWilliam J British Moyd FNP

## 2013-12-26 NOTE — Patient Instructions (Signed)

## 2014-06-02 ENCOUNTER — Telehealth: Payer: Self-pay | Admitting: Family Medicine

## 2014-06-02 NOTE — Telephone Encounter (Signed)
Abd pain that hurts through to her back. She had similar pain last year but source was undetermined.  Pain is worse after eating. No other associated symptoms.  Appt scheduled for tomorrow morning.  Patient aware.

## 2014-06-03 ENCOUNTER — Encounter: Payer: Self-pay | Admitting: Family Medicine

## 2014-06-03 ENCOUNTER — Ambulatory Visit (INDEPENDENT_AMBULATORY_CARE_PROVIDER_SITE_OTHER): Payer: Managed Care, Other (non HMO) | Admitting: Family Medicine

## 2014-06-03 ENCOUNTER — Ambulatory Visit (INDEPENDENT_AMBULATORY_CARE_PROVIDER_SITE_OTHER): Payer: Managed Care, Other (non HMO)

## 2014-06-03 VITALS — BP 117/78 | HR 94 | Temp 98.0°F | Ht 65.0 in | Wt 169.0 lb

## 2014-06-03 DIAGNOSIS — R109 Unspecified abdominal pain: Secondary | ICD-10-CM

## 2014-06-03 DIAGNOSIS — F329 Major depressive disorder, single episode, unspecified: Secondary | ICD-10-CM

## 2014-06-03 DIAGNOSIS — R110 Nausea: Secondary | ICD-10-CM

## 2014-06-03 DIAGNOSIS — F411 Generalized anxiety disorder: Secondary | ICD-10-CM

## 2014-06-03 DIAGNOSIS — F3289 Other specified depressive episodes: Secondary | ICD-10-CM

## 2014-06-03 DIAGNOSIS — K5900 Constipation, unspecified: Secondary | ICD-10-CM

## 2014-06-03 DIAGNOSIS — F32A Depression, unspecified: Secondary | ICD-10-CM

## 2014-06-03 DIAGNOSIS — R1011 Right upper quadrant pain: Secondary | ICD-10-CM

## 2014-06-03 LAB — POCT CBC
Granulocyte percent: 75.6 %G (ref 37–80)
HCT, POC: 46.2 % (ref 37.7–47.9)
Hemoglobin: 15.6 g/dL (ref 12.2–16.2)
Lymph, poc: 3.1 (ref 0.6–3.4)
MCH, POC: 31.3 pg — AB (ref 27–31.2)
MCHC: 33.7 g/dL (ref 31.8–35.4)
MCV: 92.9 fL (ref 80–97)
MPV: 7.6 fL (ref 0–99.8)
POC Granulocyte: 11.3 — AB (ref 2–6.9)
POC LYMPH PERCENT: 20.7 %L (ref 10–50)
Platelet Count, POC: 291 10*3/uL (ref 142–424)
RBC: 5 M/uL (ref 4.04–5.48)
RDW, POC: 14.1 %
WBC: 15 10*3/uL — AB (ref 4.6–10.2)

## 2014-06-03 LAB — POCT UA - MICROSCOPIC ONLY
Casts, Ur, LPF, POC: NEGATIVE
Crystals, Ur, HPF, POC: NEGATIVE
Mucus, UA: NEGATIVE
RBC, urine, microscopic: NEGATIVE
WBC, Ur, HPF, POC: NEGATIVE
Yeast, UA: NEGATIVE

## 2014-06-03 LAB — POCT URINALYSIS DIPSTICK
Bilirubin, UA: NEGATIVE
Blood, UA: NEGATIVE
Glucose, UA: NEGATIVE
Ketones, UA: NEGATIVE
Leukocytes, UA: NEGATIVE
Nitrite, UA: NEGATIVE
Protein, UA: NEGATIVE
Spec Grav, UA: <=1.005
Urobilinogen, UA: NEGATIVE
pH, UA: 6

## 2014-06-03 MED ORDER — ONDANSETRON 4 MG PO TBDP: 4.0000 mg | ORAL_TABLET | Freq: Three times a day (TID) | ORAL | Status: DC | PRN

## 2014-06-03 MED ORDER — ALPRAZOLAM 0.5 MG PO TABS: 0.5000 mg | ORAL_TABLET | Freq: Two times a day (BID) | ORAL | Status: DC | PRN

## 2014-06-03 MED ORDER — CITALOPRAM HYDROBROMIDE 40 MG PO TABS: 40.0000 mg | ORAL_TABLET | Freq: Every day | ORAL | Status: DC

## 2014-06-03 MED ORDER — HYDROCODONE-ACETAMINOPHEN 5-325 MG PO TABS: 1.0000 | ORAL_TABLET | Freq: Four times a day (QID) | ORAL | Status: DC | PRN

## 2014-06-03 MED ORDER — POLYETHYLENE GLYCOL 3350 17 GM/SCOOP PO POWD: 17.0000 g | Freq: Two times a day (BID) | ORAL | Status: DC | PRN

## 2014-06-03 NOTE — Progress Notes (Signed)
   Subjective:    Patient ID: Sarah Mcgee, female    DOB: 12-25-1966, 48 y.o.   MRN: [id number]  HPI  This 48 y.o. female presents for evaluation of persistent right upper quadrant abdominal pain and  Back pain that is severe.  She had similar episode a year ago and underwent US and hida scan which were normal.  She is having some nausea.  She denies any UA sx's.  Review of Systems C/o right upper quadrant abdominal pain and back pain   No chest pain, SOB, HA, dizziness, vision change, N/V, diarrhea, constipation, dysuria, urinary urgency or frequency, myalgias, arthralgias or rash.  Objective:   Physical Exam  Vital signs noted  Well developed well nourished female.  HEENT - Head atraumatic Normocephalic Respiratory - Lungs CTA bilateral Cardiac - RRR S1 and S2 without murmur GI - Abdomen soft tender right upper quadrant MS - TTP right back proximal to lower costal and positive CVA tenderness on right side.  KUB - Pelvic phlebolith's, constipaiton Prelimnary reading by Angeline Slim      Results for orders placed in visit on 06/03/14  POCT UA - MICROSCOPIC ONLY      Result Value Ref Range   WBC, Ur, HPF, POC NEG     RBC, urine, microscopic NEG     Bacteria, U Microscopic OCC     Mucus, UA NEG     Epithelial cells, urine per micros OCC     Crystals, Ur, HPF, POC NEG     Casts, Ur, LPF, POC NEG     Yeast, UA NEG    POCT URINALYSIS DIPSTICK      Result Value Ref Range   Color, UA GOLD     Clarity, UA CLEAR     Glucose, UA NEG     Bilirubin, UA NEG     Ketones, UA NEG     Spec Grav, UA <=1.005     Blood, UA NEG     pH, UA 6.0     Protein, UA NEG     Urobilinogen, UA negative     Nitrite, UA NEG     Leukocytes, UA Negative     Assessment & Plan:  Abdominal pain, unspecified site - Plan: POCT UA - Microscopic Only, POCT urinalysis dipstick, DG Abd 1 View, POCT CBC, HYDROcodone-acetaminophen (NORCO/VICODIN) 5-325 MG per tablet, ondansetron (ZOFRAN-ODT) 4 MG  disintegrating tablet  Abdominal pain, right upper quadrant - Plan: POCT CBC, HYDROcodone-acetaminophen (NORCO/VICODIN) 5-325 MG per tablet, ondansetron (ZOFRAN-ODT) 4 MG disintegrating tablet  Nausea - Plan: POCT CBC, HYDROcodone-acetaminophen (NORCO/VICODIN) 5-325 MG per tablet, ondansetron (ZOFRAN-ODT) 4 MG disintegrating tablet  Depression - Plan: citalopram (CELEXA) 40 MG tablet  Generalized anxiety disorder - Plan: ALPRAZolam (XANAX) 0.5 MG tablet  Unspecified constipation - Plan: Ambulatory referral to Gastroenterology  Spent over 45 minutes with patient in visit today  Deatra Canter FNP

## 2015-03-30 ENCOUNTER — Encounter: Payer: Self-pay | Admitting: Nurse Practitioner

## 2015-03-30 ENCOUNTER — Ambulatory Visit (INDEPENDENT_AMBULATORY_CARE_PROVIDER_SITE_OTHER): Payer: Managed Care, Other (non HMO) | Admitting: Nurse Practitioner

## 2015-03-30 VITALS — BP 124/81 | HR 92 | Temp 98.1°F | Ht 65.0 in | Wt 169.2 lb

## 2015-03-30 DIAGNOSIS — F329 Major depressive disorder, single episode, unspecified: Secondary | ICD-10-CM

## 2015-03-30 DIAGNOSIS — F411 Generalized anxiety disorder: Secondary | ICD-10-CM

## 2015-03-30 DIAGNOSIS — M5442 Lumbago with sciatica, left side: Secondary | ICD-10-CM

## 2015-03-30 DIAGNOSIS — F32A Depression, unspecified: Secondary | ICD-10-CM

## 2015-03-30 DIAGNOSIS — M5441 Lumbago with sciatica, right side: Secondary | ICD-10-CM | POA: Diagnosis not present

## 2015-03-30 MED ORDER — ALPRAZOLAM 0.5 MG PO TABS: 0.5000 mg | ORAL_TABLET | Freq: Two times a day (BID) | ORAL | Status: DC | PRN

## 2015-03-30 MED ORDER — CYCLOBENZAPRINE HCL 10 MG PO TABS: 10.0000 mg | ORAL_TABLET | Freq: Three times a day (TID) | ORAL | Status: DC | PRN

## 2015-03-30 MED ORDER — NAPROXEN 500 MG PO TABS: 500.0000 mg | ORAL_TABLET | Freq: Two times a day (BID) | ORAL | Status: DC

## 2015-03-30 NOTE — Progress Notes (Signed)
  Subjective:    Sarah Mcgee is a 49 y.o. female who presents for evaluation of low back pain. The patient has had recurrent self limited episodes of low back pain in the past. Symptoms have been present for 2 years and are gradually worsening in the past couple of days.  Onset was related to / precipitated by no known injury. The pain is located in the across the lower back and radiates to the right lower leg, left lower leg. The pain is described as burning and occurs all day. She rates her pain as a 8 on a scale of 0-10. Symptoms are exacerbated by sitting and twisting. Symptoms are improved by ice and NSAIDs. She has also tried ice which provided no symptom relief. She has tingling in the right leg, tingling in the left leg and mainly in her feet while walking.  associated with the back pain. The patient has no "red flag" history indicative of complicated back pain.  The following portions of the patient's history were reviewed and updated as appropriate: allergies, current medications, past family history, past medical history, past social history, past surgical history and problem list.  Review of Systems Pertinent items are noted in HPI.    Objective:   Full range of motion without pain, no tenderness, no spasm, no curvature. Normal reflexes, gait, strength and negative straight-leg raise. Tenderness with right and left hip flexion.   Assessment:    lower back pain.   Plan:    Meds ordered this encounter  Medications  . ALPRAZolam (XANAX) 0.5 MG tablet    Sig: Take 1 tablet (0.5 mg total) by mouth 2 (two) times daily as needed for anxiety.    Dispense:  60 tablet    Refill:  0    Order Specific Question:  Supervising Provider    Answer:  Ernestina PennaMOORE, DONALD W [[id number]]  . cyclobenzaprine (FLEXERIL) 10 MG tablet    Sig: Take 1 tablet (10 mg total) by mouth 3 (three) times daily as needed for muscle spasms.    Dispense:  30 tablet    Refill:  1    Order Specific Question:  Supervising  Provider    Answer:  Ernestina PennaMOORE, DONALD W [[id number]]  . naproxen (NAPROSYN) 500 MG tablet    Sig: Take 1 tablet (500 mg total) by mouth 2 (two) times daily with a meal.    Dispense:  60 tablet    Refill:  1    Order Specific Question:  Supervising Provider    Answer:  Ernestina PennaMOORE, DONALD W [[id number]]    Back strengthening exercise  Moist heat Stop if hurting  RTO PRN   Mary-Margaret Daphine DeutscherMartin, FNP

## 2015-03-30 NOTE — Patient Instructions (Signed)
Back Pain, Adult Low back pain is very common. About 1 in 5 people have back pain.The cause of low back pain is rarely dangerous. The pain often gets better over time.About half of people with a sudden onset of back pain feel better in just 2 weeks. About 8 in 10 people feel better by 6 weeks.  CAUSES Some common causes of back pain include:  Strain of the muscles or ligaments supporting the spine.  Wear and tear (degeneration) of the spinal discs.  Arthritis.  Direct injury to the back. DIAGNOSIS Most of the time, the direct cause of low back pain is not known.However, back pain can be treated effectively even when the exact cause of the pain is unknown.Answering your caregiver's questions about your overall health and symptoms is one of the most accurate ways to make sure the cause of your pain is not dangerous. If your caregiver needs more information, he or she may order lab work or imaging tests (X-rays or MRIs).However, even if imaging tests show changes in your back, this usually does not require surgery. HOME CARE INSTRUCTIONS For many people, back pain returns.Since low back pain is rarely dangerous, it is often a condition that people can learn to manageon their own.   Remain active. It is stressful on the back to sit or stand in one place. Do not sit, drive, or stand in one place for more than 30 minutes at a time. Take short walks on level surfaces as soon as pain allows.Try to increase the length of time you walk each day.  Do not stay in bed.Resting more than 1 or 2 days can delay your recovery.  Do not avoid exercise or work.Your body is made to move.It is not dangerous to be active, even though your back may hurt.Your back will likely heal faster if you return to being active before your pain is gone.  Pay attention to your body when you bend and lift. Many people have less discomfortwhen lifting if they bend their knees, keep the load close to their bodies,and  avoid twisting. Often, the most comfortable positions are those that put less stress on your recovering back.  Find a comfortable position to sleep. Use a firm mattress and lie on your side with your knees slightly bent. If you lie on your back, put a pillow under your knees.  Only take over-the-counter or prescription medicines as directed by your caregiver. Over-the-counter medicines to reduce pain and inflammation are often the most helpful.Your caregiver may prescribe muscle relaxant drugs.These medicines help dull your pain so you can more quickly return to your normal activities and healthy exercise.  Put ice on the injured area.  Put ice in a plastic bag.  Place a towel between your skin and the bag.  Leave the ice on for 15-20 minutes, 03-04 times a day for the first 2 to 3 days. After that, ice and heat may be alternated to reduce pain and spasms.  Ask your caregiver about trying back exercises and gentle massage. This may be of some benefit.  Avoid feeling anxious or stressed.Stress increases muscle tension and can worsen back pain.It is important to recognize when you are anxious or stressed and learn ways to manage it.Exercise is a great option. SEEK MEDICAL CARE IF:  You have pain that is not relieved with rest or medicine.  You have pain that does not improve in 1 week.  You have new symptoms.  You are generally not feeling well. SEEK   IMMEDIATE MEDICAL CARE IF:   You have pain that radiates from your back into your legs.  You develop new bowel or bladder control problems.  You have unusual weakness or numbness in your arms or legs.  You develop nausea or vomiting.  You develop abdominal pain.  You feel faint. Document Released: 12/11/2005 Document Revised: 06/11/2012 Document Reviewed: 04/14/2014 Exit[employer] Patient Information 2015 [employer]. This information is not intended to replace advice given to you by your health care provider. Make sure you  discuss any questions you have with your health care provider.  

## 2015-03-31 ENCOUNTER — Other Ambulatory Visit: Payer: Self-pay | Admitting: *Deleted

## 2015-03-31 ENCOUNTER — Telehealth: Payer: Self-pay | Admitting: Nurse Practitioner

## 2015-03-31 DIAGNOSIS — F329 Major depressive disorder, single episode, unspecified: Secondary | ICD-10-CM

## 2015-03-31 DIAGNOSIS — F32A Depression, unspecified: Secondary | ICD-10-CM

## 2015-03-31 MED ORDER — CITALOPRAM HYDROBROMIDE 40 MG PO TABS: 40.0000 mg | ORAL_TABLET | Freq: Every day | ORAL | Status: DC

## 2015-03-31 NOTE — Telephone Encounter (Signed)
Ok to write new not to include monday

## 2015-03-31 NOTE — Telephone Encounter (Signed)
Patient aware that note has been wrote and faxed to employer.

## 2015-03-31 NOTE — Telephone Encounter (Signed)
Patient wants to know if Dr. Notes can include Monday also. If so please fax to [phone]

## 2015-04-01 ENCOUNTER — Telehealth: Payer: Self-pay | Admitting: Nurse Practitioner

## 2015-04-01 NOTE — Telephone Encounter (Signed)
Patient aware that her note will be up front to be picked up since she needs it sign.

## 2015-04-02 ENCOUNTER — Telehealth: Payer: Self-pay | Admitting: Nurse Practitioner

## 2015-04-07 NOTE — Telephone Encounter (Signed)
Alm[employer] CoasterGina can you give them a call regarding this?  Thanks

## 2015-04-09 NOTE — Telephone Encounter (Signed)
Returned call to [employer], they do not have a claim for the name or this date of birth.

## 2016-01-24 ENCOUNTER — Ambulatory Visit (INDEPENDENT_AMBULATORY_CARE_PROVIDER_SITE_OTHER): Payer: Managed Care, Other (non HMO) | Admitting: Family Medicine

## 2016-01-24 ENCOUNTER — Encounter: Payer: Self-pay | Admitting: Family Medicine

## 2016-01-24 VITALS — BP 131/79 | HR 99 | Temp 97.4°F | Ht 65.0 in | Wt 173.6 lb

## 2016-01-24 DIAGNOSIS — J0100 Acute maxillary sinusitis, unspecified: Secondary | ICD-10-CM | POA: Diagnosis not present

## 2016-01-24 DIAGNOSIS — F411 Generalized anxiety disorder: Secondary | ICD-10-CM | POA: Diagnosis not present

## 2016-01-24 MED ORDER — VENLAFAXINE HCL ER 37.5 MG PO TB24: ORAL_TABLET | ORAL | Status: DC

## 2016-01-24 MED ORDER — FLUCONAZOLE 150 MG PO TABS: ORAL_TABLET | ORAL | Status: DC

## 2016-01-24 MED ORDER — ALPRAZOLAM 0.5 MG PO TABS: 0.5000 mg | ORAL_TABLET | Freq: Two times a day (BID) | ORAL | Status: DC | PRN

## 2016-01-24 MED ORDER — AMOXICILLIN-POT CLAVULANATE 875-125 MG PO TABS: 1.0000 | ORAL_TABLET | Freq: Two times a day (BID) | ORAL | Status: DC

## 2016-01-24 NOTE — Progress Notes (Signed)
   HPI  Patient presents today here to discuss suinus issues and mood  Mood Described as Depression and anxiety Denies SI Previously she was using Xanax daily Difficult times with her youngest son right now States off of meds X 3-4 months and wants to restart.  She also states that she's had intermittent night sweats lately, drenching her shirt several times a night.  Sinus and ear pain. Patient explains that she's had about 4 days now of right-sided ear pain, muffled hearing, sinus tenderness She's had green discharge She is a smoker. Breathing easily. Tolerating food and fluids easily.  PMH: Smoking status noted ROS: Per HPI  Objective: BP 131/79 mmHg  Pulse 99  Temp(Src) 97.4 F (36.3 C) (Oral)  Ht  (1.651 m)  Wt 173 lb 9.6 oz (78.744 kg)  BMI 28.89 kg/m2 Gen: NAD, alert, cooperative with exam HEENT: NCAT,TMs normal bilaterally, oropharynx clear, bilateral axillary sinus tenderness to palpation CV: RRR, good S1/S2, no murmur Resp: Expiratory wheezes throughout, nonlabored Ext: No edema, warm Neuro: Alert and oriented, No gross deficits  Psych: Denies suicidal ideation, normal mood and affect, tearful at times  Assessment and plan:  #mood disorder, generalized anxiety disorder Starting venlafaxine, I believe this will also help with hot flashes Discussed appropriate use of Xanax, not an every day medication Follow-up 3-4 weeks  #  Acute maxillary sinusitis Treating with Augmentin She gets yeast infections with Augmentin, sent Diflucan F/u 3 weeks    Meds ordered this encounter  Medications  . amoxicillin-clavulanate (AUGMENTIN) 875-125 MG tablet    Sig: Take 1 tablet by mouth 2 (two) times daily.    Dispense:  20 tablet    Refill:  0  . Venlafaxine HCl 37.5 MG TB24    Sig: 1 pill daily for 1 week, then 2 pills daily    Dispense:  53 each    Refill:  0  . ALPRAZolam (XANAX) 0.5 MG tablet    Sig: Take 1 tablet (0.5 mg total) by mouth 2 (two) times  daily as needed for anxiety.    Dispense:  30 tablet    Refill:  0    Murtis Sink, MD [facility] 01/24/2016, 10:08 AM

## 2016-01-24 NOTE — Patient Instructions (Signed)
Great to meet you!  Remember, xanax is a [employer] social [employer] officer, not an everyday medicine.  Venlafaxine will help your mood an your hot flashes.  Augmentin is for your sinus infecion , be sure to complete the entire course.   Come back in 3-4 weeks   Taking the medicine as directed and not missing any doses is one of the best things you can do to treat your depression.  Here are some things to keep in mind:  1) Side effects (stomach upset, some increased anxiety) may happen before you notice a benefit.  These side effects typically go away over time. 2) Changes to your dose of medicine or a change in medication all together is sometimes necessary 3) Most people need to be on medication at least 6-12 months 4) Many people will notice an improvement within two weeks but the full effect of the medication can take up to 4-6 weeks 5) Stopping the medication when you start feeling better often results in a return of symptoms If you start having thoughts of hurting yourself or others after starting this medicine, please call 911 immediately

## 2016-02-14 ENCOUNTER — Encounter: Payer: Self-pay | Admitting: Family Medicine

## 2016-02-14 ENCOUNTER — Ambulatory Visit (INDEPENDENT_AMBULATORY_CARE_PROVIDER_SITE_OTHER): Payer: Managed Care, Other (non HMO) | Admitting: Family Medicine

## 2016-02-14 VITALS — BP 156/97 | HR 75 | Temp 97.1°F | Ht 65.0 in | Wt 179.2 lb

## 2016-02-14 DIAGNOSIS — R0789 Other chest pain: Secondary | ICD-10-CM

## 2016-02-14 DIAGNOSIS — E785 Hyperlipidemia, unspecified: Secondary | ICD-10-CM

## 2016-02-14 DIAGNOSIS — F17200 Nicotine dependence, unspecified, uncomplicated: Secondary | ICD-10-CM | POA: Diagnosis not present

## 2016-02-14 DIAGNOSIS — F411 Generalized anxiety disorder: Secondary | ICD-10-CM | POA: Diagnosis not present

## 2016-02-14 MED ORDER — ALPRAZOLAM 0.5 MG PO TABS: 0.5000 mg | ORAL_TABLET | Freq: Two times a day (BID) | ORAL | Status: DC | PRN

## 2016-02-14 MED ORDER — VENLAFAXINE HCL ER 75 MG PO CP24: 75.0000 mg | ORAL_CAPSULE | Freq: Every day | ORAL | Status: DC

## 2016-02-14 NOTE — Progress Notes (Signed)
   HPI  Patient presents today here for follow-up after starting effexor and chest pain.  Chest pain 1-2 days of chest pain, described as left-sided dull achy chest pain worse with increased stress, nonexertional Associated with sweating and weakness. No shortness of breath Her grandfather had heart attack after the age of 63, her father had a stroke in his 2s. She's a smoker Her total cholesterol was found to be 213 with an LDL of 144 and 2014. Some radiation to her left arm.  She has not taken any aspirin.  Denies any suicidal thoughts. No GI upset with effexor No improvement in anxiety  PMH: Smoking status noted ROS: Per HPI  Objective: BP 156/97 mmHg  Pulse 75  Temp(Src) 97.1 F (36.2 C) (Oral)  Ht  (1.651 m)  Wt 179 lb 3.2 oz (81.285 kg)  BMI 29.82 kg/m2 Gen: NAD, alert, cooperative with exam HEENT: NCAT CV: RRR, good S1/S2, no murmur, pain reducible with palpation of the left chest wall Resp: CTABL, no wheezes, non-labored Ext: No edema, warm Neuro: Alert and oriented, No gross deficits  Assessment and plan:  # Atypical chest pain Result chest pain with palpation of left chest wall EKG normal today Risk factors include hyperlipidemia, smoking Labs today Refer to cardiology for possible stress test  # GAD Tolerating effexor Continue 75 mg dose, refill sent, now just 75 mg pills Follow up 2 months  Smoking Discussed, she is not ready to quit yet but is considering  HLD Labs, nonfasting    Orders Placed This Encounter  Procedures  . EKG 12-Lead    Meds ordered this encounter  Medications  . venlafaxine XR (EFFEXOR XR) 75 MG 24 hr capsule    Sig: Take 1 capsule (75 mg total) by mouth daily with breakfast.    Dispense:  30 capsule    Refill:  3    Murtis Sink, MD [facility] 02/14/2016, 4:57 PM

## 2016-02-14 NOTE — Patient Instructions (Signed)

## 2016-02-15 LAB — CMP14+EGFR
A/G RATIO: 1.5 (ref 1.1–2.5)
ALT: 60 IU/L — ABNORMAL HIGH (ref 0–32)
AST: 37 IU/L (ref 0–40)
Albumin: 4.4 g/dL (ref 3.5–5.5)
Alkaline Phosphatase: 123 IU/L — ABNORMAL HIGH (ref 39–117)
BILIRUBIN TOTAL: 0.3 mg/dL (ref 0.0–1.2)
BUN / CREAT RATIO: 14 (ref 9–23)
BUN: 9 mg/dL (ref 6–24)
CHLORIDE: 99 mmol/L (ref 96–106)
CO2: 23 mmol/L (ref 18–29)
Calcium: 9.4 mg/dL (ref 8.7–10.2)
Creatinine, Ser: 0.66 mg/dL (ref 0.57–1.00)
GFR calc Af Amer: 119 mL/min/{1.73_m2} (ref >59)
GFR calc non Af Amer: 103 mL/min/{1.73_m2} (ref >59)
GLOBULIN, TOTAL: 2.9 g/dL (ref 1.5–4.5)
Glucose: 99 mg/dL (ref 65–99)
Potassium: 4.2 mmol/L (ref 3.5–5.2)
SODIUM: 139 mmol/L (ref 134–144)
Total Protein: 7.3 g/dL (ref 6.0–8.5)

## 2016-02-15 LAB — CBC
HEMOGLOBIN: 16.4 g/dL — AB (ref 11.1–15.9)
Hematocrit: 47.9 % — ABNORMAL HIGH (ref 34.0–46.6)
MCH: 32.3 pg (ref 26.6–33.0)
MCHC: 34.2 g/dL (ref 31.5–35.7)
MCV: 94 fL (ref 79–97)
PLATELETS: 291 10*3/uL (ref 150–379)
RBC: 5.08 x10E6/uL (ref 3.77–5.28)
RDW: 13.8 % (ref 12.3–15.4)
WBC: 10.9 10*3/uL — ABNORMAL HIGH (ref 3.4–10.8)

## 2016-02-15 LAB — LIPID PANEL
CHOLESTEROL TOTAL: 241 mg/dL — AB (ref 100–199)
Chol/HDL Ratio: 6.9 ratio units — ABNORMAL HIGH (ref 0.0–4.4)
HDL: 35 mg/dL — ABNORMAL LOW (ref >39)
LDL Calculated: 135 mg/dL — ABNORMAL HIGH (ref 0–99)
Triglycerides: 356 mg/dL — ABNORMAL HIGH (ref 0–149)
VLDL Cholesterol Cal: 71 mg/dL — ABNORMAL HIGH (ref 5–40)

## 2016-02-15 LAB — TSH: TSH: 3.08 u[IU]/mL (ref 0.450–4.500)

## 2016-02-17 ENCOUNTER — Other Ambulatory Visit: Payer: Self-pay | Admitting: *Deleted

## 2016-02-17 LAB — PATHOLOGIST SMEAR REVIEW
Path Rev PLTs: NORMAL
Path Rev RBC: NORMAL
Path Rev WBC: NORMAL

## 2016-02-17 LAB — SPECIMEN STATUS REPORT

## 2016-02-17 MED ORDER — ATORVASTATIN CALCIUM 20 MG PO TABS: 20.0000 mg | ORAL_TABLET | Freq: Every day | ORAL | Status: DC

## 2016-02-19 ENCOUNTER — Telehealth: Payer: Self-pay | Admitting: Nurse Practitioner

## 2016-02-19 NOTE — Telephone Encounter (Signed)
Patient told to watch blood pressure over weekend and come in Monday to be seen with diary of blood pressures- she has not had a history of high blood pressure according to chart.

## 2016-02-21 ENCOUNTER — Ambulatory Visit (INDEPENDENT_AMBULATORY_CARE_PROVIDER_SITE_OTHER): Payer: Managed Care, Other (non HMO) | Admitting: Family Medicine

## 2016-02-21 ENCOUNTER — Encounter: Payer: Self-pay | Admitting: Family Medicine

## 2016-02-21 VITALS — BP 142/87 | HR 90 | Temp 97.8°F | Ht 65.0 in | Wt 176.4 lb

## 2016-02-21 DIAGNOSIS — F411 Generalized anxiety disorder: Secondary | ICD-10-CM

## 2016-02-21 DIAGNOSIS — I10 Essential (primary) hypertension: Secondary | ICD-10-CM | POA: Diagnosis not present

## 2016-02-21 DIAGNOSIS — R0789 Other chest pain: Secondary | ICD-10-CM | POA: Diagnosis not present

## 2016-02-21 MED ORDER — VENLAFAXINE HCL ER 37.5 MG PO CP24: 37.5000 mg | ORAL_CAPSULE | Freq: Every day | ORAL | Status: DC

## 2016-02-21 MED ORDER — TRIAMTERENE-HCTZ 37.5-25 MG PO TABS: 1.0000 | ORAL_TABLET | Freq: Every day | ORAL | Status: DC

## 2016-02-21 NOTE — Telephone Encounter (Signed)
Pt already has an appt today at 12:25.

## 2016-02-21 NOTE — Progress Notes (Signed)
Subjective:  Patient ID: Sarah Mcgee, female    DOB: 01-02-1966  Age: 50 y.o. MRN: [id number]  CC: Hypertension   HPI Amar Keenum presents for  follow-up of hypertension. Patient has no history of headache chest pain or shortness of breath or recent cough. Patient also denies symptoms of TIA such as numbness weakness lateralizing. Patient checks  blood pressure at home and has not had any elevated readings recently. Patient denies side effects from medication. States taking it regularly.Father had a stroke at age 46. Has had multiple readings she is written down ranging from 133/91 yesterday she has high as 176/107 at work. After work she went to the hospital and it was 167/95. Hospital report reviewed showing that she had serial enzymes which were negative for heart attack. EKG in normal range. CTPA performed showing no sign of pulmonary embolism. Patient was recently started on Effexor. Since starting on the Effexor is when she started gradually showing increasing symptoms it is possible that the Effexor is causing some of them. Husband is with her and raises that as a question.   History Landy has a past medical history of Anxiety; Depression; and Hyperlipidemia.   She has past surgical history that includes Abdominal hysterectomy; Cesarean section; and arm surgery (Right).   Her family history is not on file.She reports that she quit smoking about 3 years ago. Her smoking use included Cigarettes. She has never used smokeless tobacco. She reports that she drinks alcohol. She reports that she does not use illicit drugs.  Current Outpatient Prescriptions on File Prior to Visit  Medication Sig Dispense Refill  . ALPRAZolam (XANAX) 0.5 MG tablet Take 1 tablet (0.5 mg total) by mouth 2 (two) times daily as needed for anxiety. 30 tablet 0  . atorvastatin (LIPITOR) 20 MG tablet Take 1 tablet (20 mg total) by mouth daily. 90 tablet 1  . naproxen (NAPROSYN) 500 MG tablet Take 1 tablet (500 mg  total) by mouth 2 (two) times daily with a meal. 60 tablet 1   No current facility-administered medications on file prior to visit.    ROS Review of Systems  Constitutional: Positive for diaphoresis (frequent sweating spells recently.). Negative for fever, activity change and appetite change.  HENT: Negative for congestion, rhinorrhea and sore throat.   Eyes: Negative for visual disturbance.  Respiratory: Negative for cough and shortness of breath.   Cardiovascular: Negative for chest pain and palpitations.  Gastrointestinal: Negative for nausea, abdominal pain and diarrhea.  Genitourinary: Negative for dysuria.  Musculoskeletal: Negative for myalgias and arthralgias.    Objective:  BP 142/87 mmHg  Pulse 90  Temp(Src) 97.8 F (36.6 C) (Oral)  Ht  (1.651 m)  Wt 176 lb 6.4 oz (80.015 kg)  BMI 29.35 kg/m2  SpO2 98%  BP Readings from Last 3 Encounters:  02/21/16 142/87  02/14/16 156/97  01/24/16 131/79    Wt Readings from Last 3 Encounters:  02/21/16 176 lb 6.4 oz (80.015 kg)  02/14/16 179 lb 3.2 oz (81.285 kg)  01/24/16 173 lb 9.6 oz (78.744 kg)     Physical Exam  Constitutional: She is oriented to person, place, and time. She appears well-developed and well-nourished. No distress.  HENT:  Head: Normocephalic and atraumatic.  Right Ear: External ear normal.  Left Ear: External ear normal.  Nose: Nose normal.  Mouth/Throat: Oropharynx is clear and moist.  Eyes: Conjunctivae and EOM are normal. Pupils are equal, round, and reactive to light.  Neck: Normal range of motion. Neck  supple. No thyromegaly present.  Cardiovascular: Normal rate, regular rhythm and normal heart sounds.   No murmur heard. Pulmonary/Chest: Effort normal and breath sounds normal. No respiratory distress. She has no wheezes. She has no rales.  Abdominal: Soft. Bowel sounds are normal. She exhibits no distension. There is no tenderness.  Lymphadenopathy:    She has no cervical adenopathy.    Neurological: She is alert and oriented to person, place, and time. She has normal reflexes.  Skin: Skin is warm and dry.  Psychiatric: She has a normal mood and affect. Her behavior is normal. Judgment and thought content normal.     Lab Results  Component Value Date   WBC 10.9* 02/14/2016   HGB 15.6 06/03/2014   HCT 47.9* 02/14/2016   PLT 291 02/14/2016   GLUCOSE 99 02/14/2016   CHOL 241* 02/14/2016   TRIG 356* 02/14/2016   HDL 35* 02/14/2016   LDLCALC 135* 02/14/2016   ALT 60* 02/14/2016   AST 37 02/14/2016   NA 139 02/14/2016   K 4.2 02/14/2016   CL 99 02/14/2016   CREATININE 0.66 02/14/2016   BUN 9 02/14/2016   CO2 23 02/14/2016   TSH 3.080 02/14/2016    No results found.  Assessment & Plan:   Pat was seen today for hypertension.  Diagnoses and all orders for this visit:  GAD (generalized anxiety disorder)  Atypical chest pain  Accelerated hypertension  Other orders -     venlafaxine XR (EFFEXOR XR) 37.5 MG 24 hr capsule; Take 1 capsule (37.5 mg total) by mouth daily with breakfast. -     triamterene-hydrochlorothiazide (MAXZIDE-25) 37.5-25 MG tablet; Take 1 tablet by mouth daily.   I have discontinued Ms. Mayville's venlafaxine XR. I am also having her start on venlafaxine XR and triamterene-hydrochlorothiazide. Additionally, I am having her maintain her naproxen, ALPRAZolam, atorvastatin, and diclofenac.  Meds ordered this encounter  Medications  . diclofenac (VOLTAREN) 75 MG EC tablet    Sig: Take 75 mg by mouth 2 (two) times daily.   Marland Kitchen venlafaxine XR (EFFEXOR XR) 37.5 MG 24 hr capsule    Sig: Take 1 capsule (37.5 mg total) by mouth daily with breakfast.    Dispense:  30 capsule    Refill:  0  . triamterene-hydrochlorothiazide (MAXZIDE-25) 37.5-25 MG tablet    Sig: Take 1 tablet by mouth daily.    Dispense:  90 tablet    Refill:  3    Likely reaction to effexor.  Follow-up: Return in about 1 week (around 02/28/2016) for hypertension.  Mechele Claude, M.D.

## 2016-02-22 ENCOUNTER — Other Ambulatory Visit: Payer: Self-pay

## 2016-02-22 ENCOUNTER — Telehealth: Payer: Self-pay | Admitting: Family Medicine

## 2016-02-22 MED ORDER — VENLAFAXINE HCL ER 37.5 MG PO CP24: 37.5000 mg | ORAL_CAPSULE | Freq: Every day | ORAL | Status: DC

## 2016-02-22 NOTE — Progress Notes (Signed)
This encounter was created in error - please disregard.

## 2016-02-22 NOTE — Telephone Encounter (Signed)
Letter printed and faxed as requested and pt is aware.

## 2016-02-24 ENCOUNTER — Encounter: Payer: Self-pay | Admitting: Physician Assistant

## 2016-02-28 ENCOUNTER — Ambulatory Visit (INDEPENDENT_AMBULATORY_CARE_PROVIDER_SITE_OTHER): Payer: Managed Care, Other (non HMO) | Admitting: Family

## 2016-02-28 ENCOUNTER — Ambulatory Visit: Payer: Managed Care, Other (non HMO) | Admitting: Family Medicine

## 2016-02-28 ENCOUNTER — Encounter (INDEPENDENT_AMBULATORY_CARE_PROVIDER_SITE_OTHER): Payer: Self-pay

## 2016-02-28 ENCOUNTER — Encounter: Payer: Self-pay | Admitting: Family

## 2016-02-28 VITALS — BP 126/88 | HR 107 | Temp 99.1°F | Ht 65.0 in | Wt 175.8 lb

## 2016-02-28 DIAGNOSIS — Z1211 Encounter for screening for malignant neoplasm of colon: Secondary | ICD-10-CM | POA: Diagnosis not present

## 2016-02-28 DIAGNOSIS — Z1239 Encounter for other screening for malignant neoplasm of breast: Secondary | ICD-10-CM | POA: Diagnosis not present

## 2016-02-28 DIAGNOSIS — I10 Essential (primary) hypertension: Secondary | ICD-10-CM

## 2016-02-28 DIAGNOSIS — F411 Generalized anxiety disorder: Secondary | ICD-10-CM

## 2016-02-28 MED ORDER — VENLAFAXINE HCL ER 75 MG PO CP24: 75.0000 mg | ORAL_CAPSULE | Freq: Every day | ORAL | Status: DC

## 2016-02-28 NOTE — Progress Notes (Signed)
Subjective:    Patient ID: Sarah Mcgee, female    DOB: 1966-12-22, 50 y.o.   MRN: [id number]  PT presents to the office today to recheck HTN. PT's BP is at goal today! Hypertension This is a chronic problem. The current episode started more than 1 year ago. The problem has been resolved since onset. The problem is controlled. Associated symptoms include anxiety and palpitations. Pertinent negatives include no headaches, malaise/fatigue, peripheral edema or shortness of breath. Risk factors for coronary artery disease include post-menopausal state, sedentary lifestyle, family history, smoking/tobacco exposure, dyslipidemia and obesity. Past treatments include diuretics and angiotensin blockers. The current treatment provides mild improvement. There is no history of kidney disease, CAD/MI, CVA, heart failure or a thyroid problem.  Anxiety Presents for follow-up visit. Onset was 1 to 5 years ago. The problem has been waxing and waning. Symptoms include depressed mood, excessive worry, irritability, nervous/anxious behavior, palpitations and panic. Patient reports no shortness of breath or suicidal ideas. Symptoms occur most days. The quality of sleep is good. Nighttime awakenings: occasional.   Her past medical history is significant for anxiety/panic attacks and depression.      Review of Systems  Constitutional: Positive for irritability. Negative for malaise/fatigue.  HENT: Negative.   Eyes: Negative.   Respiratory: Negative.  Negative for shortness of breath.   Cardiovascular: Positive for palpitations.  Gastrointestinal: Negative.   Endocrine: Negative.   Genitourinary: Negative.   Musculoskeletal: Negative.   Neurological: Negative.  Negative for headaches.  Hematological: Negative.   Psychiatric/Behavioral: Negative for suicidal ideas. The patient is nervous/anxious.   All other systems reviewed and are negative.      Objective:   Physical Exam  Constitutional: She is  oriented to person, place, and time. She appears well-developed and well-nourished. No distress.  HENT:  Head: Normocephalic and atraumatic.  Eyes: Pupils are equal, round, and reactive to light.  Neck: Normal range of motion. Neck supple. No thyromegaly present.  Cardiovascular: Normal rate, regular rhythm, normal heart sounds and intact distal pulses.   No murmur heard. Pulmonary/Chest: Effort normal and breath sounds normal. No respiratory distress. She has no wheezes.  Abdominal: Soft. Bowel sounds are normal. She exhibits no distension. There is no tenderness.  Musculoskeletal: Normal range of motion. She exhibits no edema or tenderness.  Neurological: She is alert and oriented to person, place, and time.  Skin: Skin is warm and dry.  Psychiatric: She has a normal mood and affect. Her behavior is normal. Judgment and thought content normal.  Vitals reviewed.  BP 126/88 mmHg  Pulse 107  Temp(Src) 99.1 F (37.3 C) (Oral)  Ht '5\' 5"'  (1.651 m)  Wt 175 lb 12.8 oz (79.742 kg)  BMI 29.25 kg/m2        Assessment & Plan:  1. Essential hypertension -Dash diet information given -Exercise encouraged - Stress Management  -Continue current meds -RTO in prn and keep chronic follow up with PCP - BMP8+EGFR  2. Encounter for screening colonoscopy - Ambulatory referral to Gastroenterology - BMP8+EGFR  3. Breast cancer screening - [facility] - BMP8+EGFR  4. GAD (generalized anxiety disorder) -Pt's Effexor increased to 75 mg _ Pt states this was decreased because she has thought this was related to her HTN. Pt told to try to increase back to 75 mg and to montior BP at home. PT states she felt like the 75 mg dose was helping with her GAD - venlafaxine XR (EFFEXOR XR) 75 MG 24 hr capsule; Take 1 capsule (75  mg total) by mouth daily with breakfast.  Dispense: 90 capsule; Refill: 1   Continue all meds Labs pending Health Maintenance reviewed Diet and exercise encouraged RTO 4  weeks with Dr. Wendi Snipes to discuss GAD  Evelina Dun, FNP

## 2016-02-28 NOTE — Patient Instructions (Signed)
DASH Eating Plan °DASH stands for "Dietary Approaches to Stop Hypertension." The DASH eating plan is a healthy eating plan that has been shown to reduce high blood pressure (hypertension). Additional health benefits may include reducing the risk of type 2 diabetes mellitus, heart disease, and stroke. The DASH eating plan may also help with weight loss. °WHAT DO I NEED TO KNOW ABOUT THE DASH EATING PLAN? °For the DASH eating plan, you will follow these general guidelines: °· Choose foods with a percent daily value for sodium of less than 5% (as listed on the food label). °· Use salt-free seasonings or herbs instead of table salt or sea salt. °· Check with your health care provider or pharmacist before using salt substitutes. °· Eat lower-sodium products, often labeled as "lower sodium" or "no salt added." °· Eat fresh foods. °· Eat more vegetables, fruits, and low-fat dairy products. °· Choose whole grains. Look for the word "whole" as the first word in the ingredient list. °· Choose fish and skinless chicken or turkey more often than red meat. Limit fish, poultry, and meat to 6 oz (170 g) each day. °· Limit sweets, desserts, sugars, and sugary drinks. °· Choose heart-healthy fats. °· Limit cheese to 1 oz (28 g) per day. °· Eat more home-cooked food and less restaurant, buffet, and fast food. °· Limit fried foods. °· Cook foods using methods other than frying. °· Limit canned vegetables. If you do use them, rinse them well to decrease the sodium. °· When eating at a restaurant, ask that your food be prepared with less salt, or no salt if possible. °WHAT FOODS CAN I EAT? °Seek help from a dietitian for individual calorie needs. °Grains °Whole grain or whole wheat bread. Brown rice. Whole grain or whole wheat pasta. Quinoa, bulgur, and whole grain cereals. Low-sodium cereals. Corn or whole wheat flour tortillas. Whole grain cornbread. Whole grain crackers. Low-sodium crackers. °Vegetables °Fresh or frozen vegetables  (raw, steamed, roasted, or grilled). Low-sodium or reduced-sodium tomato and vegetable juices. Low-sodium or reduced-sodium tomato sauce and paste. Low-sodium or reduced-sodium canned vegetables.  °Fruits °All fresh, canned (in natural juice), or frozen fruits. °Meat and Other Protein Products °Ground beef (85% or leaner), grass-fed beef, or beef trimmed of fat. Skinless chicken or turkey. Ground chicken or turkey. Pork trimmed of fat. All fish and seafood. Eggs. Dried beans, peas, or lentils. Unsalted nuts and seeds. Unsalted canned beans. °Dairy °Low-fat dairy products, such as skim or 1% milk, 2% or reduced-fat cheeses, low-fat ricotta or cottage cheese, or plain low-fat yogurt. Low-sodium or reduced-sodium cheeses. °Fats and Oils °Tub margarines without trans fats. Light or reduced-fat mayonnaise and salad dressings (reduced sodium). Avocado. Safflower, olive, or canola oils. Natural peanut or almond butter. °Other °Unsalted popcorn and pretzels. °The items listed above may not be a complete list of recommended foods or beverages. Contact your dietitian for more options. °WHAT FOODS ARE NOT RECOMMENDED? °Grains °White bread. White pasta. White rice. Refined cornbread. Bagels and croissants. Crackers that contain trans fat. °Vegetables °Creamed or fried vegetables. Vegetables in a cheese sauce. Regular canned vegetables. Regular canned tomato sauce and paste. Regular tomato and vegetable juices. °Fruits °Dried fruits. Canned fruit in light or heavy syrup. Fruit juice. °Meat and Other Protein Products °Fatty cuts of meat. Ribs, chicken wings, bacon, sausage, bologna, salami, chitterlings, fatback, hot dogs, bratwurst, and packaged luncheon meats. Salted nuts and seeds. Canned beans with salt. °Dairy °Whole or 2% milk, cream, half-and-half, and cream cheese. Whole-fat or sweetened yogurt. Full-fat   cheeses or blue cheese. Nondairy creamers and whipped toppings. Processed cheese, cheese spreads, or cheese  curds. °Condiments °Onion and garlic salt, seasoned salt, table salt, and sea salt. Canned and packaged gravies. Worcestershire sauce. Tartar sauce. Barbecue sauce. Teriyaki sauce. Soy sauce, including reduced sodium. Steak sauce. Fish sauce. Oyster sauce. Cocktail sauce. Horseradish. Ketchup and mustard. Meat flavorings and tenderizers. Bouillon cubes. Hot sauce. Tabasco sauce. Marinades. Taco seasonings. Relishes. °Fats and Oils °Butter, stick margarine, lard, shortening, ghee, and bacon fat. Coconut, palm kernel, or palm oils. Regular salad dressings. °Other °Pickles and olives. Salted popcorn and pretzels. °The items listed above may not be a complete list of foods and beverages to avoid. Contact your dietitian for more information. °WHERE CAN I FIND MORE INFORMATION? °National Heart, [employer], [employer] Institute: www.nhlbi.nih.gov/health/health-topics/topics/dash/ °  °This information is not intended to replace advice given to you by your health care provider. Make sure you discuss any questions you have with your health care provider. °  °Document Released: 11/30/2011 Document Revised: 01/01/2015 Document Reviewed: 10/15/2013 °Elsevier Interactive Patient Education ©2016 Else[employer] Inc. ° °Hypertension °Hypertension, commonly called high blood pressure, is when the force of blood pumping through your arteries is too strong. Your arteries are the blood vessels that carry blood from your heart throughout your body. A blood pressure reading consists of a higher number over a lower number, such as 110/72. The higher number (systolic) is the pressure inside your arteries when your heart pumps. The lower number (diastolic) is the pressure inside your arteries when your heart relaxes. Ideally you want your blood pressure below 120/80. °Hypertension forces your heart to work harder to pump blood. Your arteries may become narrow or stiff. Having untreated or uncontrolled hypertension can cause heart attack, stroke, kidney  disease, and other problems. °RISK FACTORS °Some risk factors for high blood pressure are controllable. Others are not.  °Risk factors you cannot control include:  °· Race. You may be at higher risk if you are African American. °· Age. Risk increases with age. °· Gender. Men are at higher risk than women before age 45 years. After age 65, women are at higher risk than men. °Risk factors you can control include: °· Not getting enough exercise or physical activity. °· Being overweight. °· Getting too much fat, sugar, calories, or salt in your diet. °· Drinking too much alcohol. °SIGNS AND SYMPTOMS °Hypertension does not usually cause signs or symptoms. Extremely high blood pressure (hypertensive crisis) may cause headache, anxiety, shortness of breath, and nosebleed. °DIAGNOSIS °To check if you have hypertension, your health care provider will measure your blood pressure while you are seated, with your arm held at the level of your heart. It should be measured at least twice using the same arm. Certain conditions can cause a difference in blood pressure between your right and left arms. A blood pressure reading that is higher than normal on one occasion does not mean that you need treatment. If it is not clear whether you have high blood pressure, you may be asked to return on a different day to have your blood pressure checked again. Or, you may be asked to monitor your blood pressure at home for 1 or more weeks. °TREATMENT °Treating high blood pressure includes making lifestyle changes and possibly taking medicine. Living a healthy lifestyle can help lower high blood pressure. You may need to change some of your habits. °Lifestyle changes may include: °· Following the DASH diet. This diet is high in fruits, vegetables, and whole   grains. It is low in salt, red meat, and added sugars. °· Keep your sodium intake below 2,300 mg per day. °· Getting at least 30-45 minutes of aerobic exercise at least 4 times per  week. °· Losing weight if necessary. °· Not smoking. °· Limiting alcoholic beverages. °· Learning ways to reduce stress. °Your health care provider may prescribe medicine if lifestyle changes are not enough to get your blood pressure under control, and if one of the following is true: °· You are 18-59 years of age and your systolic blood pressure is above 140. °· You are 60 years of age or older, and your systolic blood pressure is above 150. °· Your diastolic blood pressure is above 90. °· You have diabetes, and your systolic blood pressure is over 140 or your diastolic blood pressure is over 90. °· You have kidney disease and your blood pressure is above 140/90. °· You have heart disease and your blood pressure is above 140/90. °Your personal target blood pressure may vary depending on your medical conditions, your age, and other factors. °HOME CARE INSTRUCTIONS °· Have your blood pressure rechecked as directed by your health care provider.   °· Take medicines only as directed by your health care provider. Follow the directions carefully. Blood pressure medicines must be taken as prescribed. The medicine does not work as well when you skip doses. Skipping doses also puts you at risk for problems. °· Do not smoke.   °· Monitor your blood pressure at home as directed by your health care provider.  °SEEK MEDICAL CARE IF:  °· You think you are having a reaction to medicines taken. °· You have recurrent headaches or feel dizzy. °· You have swelling in your ankles. °· You have trouble with your vision. °SEEK IMMEDIATE MEDICAL CARE IF: °· You develop a severe headache or confusion. °· You have unusual weakness, numbness, or feel faint. °· You have severe chest or abdominal pain. °· You vomit repeatedly. °· You have trouble breathing. °MAKE SURE YOU:  °· Understand these instructions. °· Will watch your condition. °· Will get help right away if you are not doing well or get worse. °  °This information is not intended to  replace advice given to you by your health care provider. Make sure you discuss any questions you have with your health care provider. °  °Document Released: 12/11/2005 Document Revised: 04/27/2015 Document Reviewed: 10/03/2013 °Elsevier Interactive Patient Education ©2016 Elsevier Inc. ° °

## 2016-03-02 ENCOUNTER — Telehealth: Payer: Self-pay | Admitting: Family Medicine

## 2016-03-06 NOTE — Telephone Encounter (Signed)
Lm, also faxed forms to [employer]

## 2016-03-08 ENCOUNTER — Ambulatory Visit: Payer: Self-pay | Admitting: Cardiology

## 2016-03-13 ENCOUNTER — Encounter: Payer: Self-pay | Admitting: Family Medicine

## 2016-03-13 ENCOUNTER — Encounter (INDEPENDENT_AMBULATORY_CARE_PROVIDER_SITE_OTHER): Payer: Self-pay

## 2016-03-13 ENCOUNTER — Ambulatory Visit (INDEPENDENT_AMBULATORY_CARE_PROVIDER_SITE_OTHER): Payer: Managed Care, Other (non HMO) | Admitting: Family Medicine

## 2016-03-13 VITALS — BP 144/97 | HR 119 | Temp 97.7°F | Ht 65.0 in | Wt 174.4 lb

## 2016-03-13 DIAGNOSIS — F411 Generalized anxiety disorder: Secondary | ICD-10-CM

## 2016-03-13 DIAGNOSIS — I10 Essential (primary) hypertension: Secondary | ICD-10-CM

## 2016-03-13 MED ORDER — METOPROLOL SUCCINATE ER 25 MG PO TB24
25.0000 mg | ORAL_TABLET | Freq: Every day | ORAL | Status: DC
Start: 2016-03-13 — End: 2016-03-17

## 2016-03-13 MED ORDER — ALPRAZOLAM 0.5 MG PO TABS: 0.5000 mg | ORAL_TABLET | Freq: Two times a day (BID) | ORAL | Status: DC | PRN

## 2016-03-13 MED ORDER — TRAZODONE HCL 100 MG PO TABS: 100.0000 mg | ORAL_TABLET | Freq: Every day | ORAL | Status: DC

## 2016-03-13 NOTE — Addendum Note (Signed)
Addended by: Elenora GammaBRADSHAW, SAMUEL L on: 03/13/2016 03:05 PM   Modules accepted: Kipp BroodSmartSet

## 2016-03-13 NOTE — Patient Instructions (Signed)
Great to see you!  Try a full dose of xanax when needed  Trazodone 30 minutes before bed  Start metoprolol once  A day for high blood pressure and racing heart

## 2016-03-13 NOTE — Progress Notes (Signed)
   HPI  Patient presents today here for anxiety  Patient explains that she's having a very difficult time right now with anxiety. He describes that she's had elevated blood pressure, racing heart, headache, nausea all seemingly due to anxious stimuli. She's going through a separation, also work is stressful.  She denies any shortness of breath. She was seen in the hospital a few weeks ago for chest pain rule out which was ruled out using troponins and EKG. She was out of work for about a week and had good improvement.  She had an episode of chest pain similar to previous yesterday. This was also associated with racing heart headache. No shortness of breath, no exertional symptoms.    PMH: Smoking status noted ROS: Per HPI  Objective: BP 144/97 mmHg  Pulse 119  Temp(Src) 97.7 F (36.5 C) (Oral)  Ht 5\' 5"  (1.651 m)  Wt 174 lb 6.4 oz (79.107 kg)  BMI 29.02 kg/m2 Gen: NAD, alert, cooperative with exam HEENT: NCAT CV: RRR, good S1/S2, no murmur Resp: CTABL, no wheezes, non-labored Ext: No edema, warm Neuro: Alert and oriented, No gross deficits  Assessment and plan:  # Anxiety Refilled Xanax, encouraged her to go ahead and try full tablet for panic attack symptoms  # Hypertension Elevated with tachycardia Adding beta blocker  # Difficulty sleeping Trial of trazodone  return to clinic in 3-4 weeks    Meds ordered this encounter  Medications  . ALPRAZolam (XANAX) 0.5 MG tablet    Sig: Take 1 tablet (0.5 mg total) by mouth 2 (two) times daily as needed for anxiety.    Dispense:  30 tablet    Refill:  0  . metoprolol succinate (TOPROL-XL) 25 MG 24 hr tablet    Sig: Take 1 tablet (25 mg total) by mouth daily.    Dispense:  90 tablet    Refill:  3  . traZODone (DESYREL) 100 MG tablet    Sig: Take 1 tablet (100 mg total) by mouth at bedtime.    Dispense:  30 tablet    Refill:  3    Murtis SinkSam Dayyan Krist, MD [facility] 03/13/2016, 2:50  PM

## 2016-03-17 ENCOUNTER — Ambulatory Visit (INDEPENDENT_AMBULATORY_CARE_PROVIDER_SITE_OTHER): Payer: Managed Care, Other (non HMO) | Admitting: Family Medicine

## 2016-03-17 ENCOUNTER — Encounter: Payer: Self-pay | Admitting: Family Medicine

## 2016-03-17 VITALS — BP 166/100 | HR 97 | Temp 97.2°F | Ht 65.0 in | Wt 178.6 lb

## 2016-03-17 DIAGNOSIS — F411 Generalized anxiety disorder: Secondary | ICD-10-CM

## 2016-03-17 DIAGNOSIS — I10 Essential (primary) hypertension: Secondary | ICD-10-CM | POA: Diagnosis not present

## 2016-03-17 MED ORDER — METOPROLOL SUCCINATE ER 25 MG PO TB24: 25.0000 mg | ORAL_TABLET | Freq: Two times a day (BID) | ORAL | Status: DC

## 2016-03-17 NOTE — Patient Instructions (Signed)
Please see a counselor as soon as you can, try [email or web address] to find one that takes your insurance  A pastor or a close friend is also a good alternative

## 2016-03-17 NOTE — Progress Notes (Signed)
   HPI  Patient presents today here with anxiety.  Patient explains that she feels like she's been having nervous breakdown. Her father got an accident a few weeks ago and she's had difficult time with her anxiety since that time. She feels very depressed but denies suicidal ideation. She's missed work the last few days and would like to get a note to excuse her through Monday.   He denies any shortness of breath or other concerns. Her blood pressure still elevated, her heart is still racing, although it's all slightly improved after starting metoprolol. She has no other side effects from metoprolol  PMH: Smoking status noted ROS: Per HPI  Objective: BP 166/100 mmHg  Pulse 97  Temp(Src) 97.2 F (36.2 C) (Oral)  Ht 5\' 5"  (1.651 m)  Wt 178 lb 9.6 oz (81.012 kg)  BMI 29.72 kg/m2 Gen: NAD, alert, cooperative with exam HEENT: NCAT Ext: No edema, warm Neuro: Alert and oriented, No gross deficits  Assessment and plan:  # Anxiety Acute stress reaction, encouraged her to seek out counseling today or discussed the problems with the pastor). Continue current medications  # Hypertension Increase metoprolol to 25 twice daily    Murtis SinkSam Aiva Miskell, MD [facility] 03/17/2016, 10:25 AM

## 2016-03-29 ENCOUNTER — Encounter: Payer: Self-pay | Admitting: Cardiology

## 2016-03-29 NOTE — Progress Notes (Signed)
     HPI: 50 year old female for evaluation of chest pain.    Current Outpatient Prescriptions  Medication Sig Dispense Refill  . ALPRAZolam (XANAX) 0.5 MG tablet Take 1 tablet (0.5 mg total) by mouth 2 (two) times daily as needed for anxiety. 30 tablet 0  . atorvastatin (LIPITOR) 20 MG tablet Take 1 tablet (20 mg total) by mouth daily. 90 tablet 1  . metoprolol succinate (TOPROL-XL) 25 MG 24 hr tablet Take 1 tablet (25 mg total) by mouth 2 (two) times daily. 90 tablet 3  . traZODone (DESYREL) 100 MG tablet Take 1 tablet (100 mg total) by mouth at bedtime. 30 tablet 3  . triamterene-hydrochlorothiazide (MAXZIDE-25) 37.5-25 MG tablet Take 1 tablet by mouth daily. 90 tablet 3  . venlafaxine XR (EFFEXOR XR) 75 MG 24 hr capsule Take 1 capsule (75 mg total) by mouth daily with breakfast. 90 capsule 1   No current facility-administered medications for this visit.    Allergies  Allergen Reactions  . Codeine     Past Medical History  Diagnosis Date  . Anxiety   . Depression   . Hyperlipidemia     Past Surgical History  Procedure Laterality Date  . Abdominal hysterectomy    . Cesarean section    . Arm surgery Right     shattered elbow    Social History   Social History  . Marital Status: Married    Spouse Name: N/A  . Number of Children: N/A  . Years of Education: N/A   Occupational History  . Not on file.   Social History Main Topics  . Smoking status: Former Smoker    Types: Cigarettes    Quit date: 12/25/2012  . Smokeless tobacco: Never Used  . Alcohol Use: Yes     Comment: occ  . Drug Use: No  . Sexual Activity: Not on file   Other Topics Concern  . Not on file   Social History Narrative    No family history on file.  ROS: no fevers or chills, productive cough, hemoptysis, dysphasia, odynophagia, melena, hematochezia, dysuria, hematuria, rash, seizure activity, orthopnea, PND, pedal edema, claudication. Remaining systems are negative.  Physical Exam:    There were no vitals taken for this visit.  General:  Well developed/well nourished in NAD Skin warm/dry Patient not depressed No peripheral clubbing Back-normal HEENT-normal/normal eyelids Neck supple/normal carotid upstroke bilaterally; no bruits; no JVD; no thyromegaly chest - CTA/ normal expansion CV - RRR/normal S1 and S2; no murmurs, rubs or gallops;  PMI nondisplaced Abdomen -NT/ND, no HSM, no mass, + bowel sounds, no bruit 2+ femoral pulses, no bruits Ext-no edema, chords, 2+ DP Neuro-grossly nonfocal  ECG 02/14/2016-sinus rhythm with no ST changes.    This encounter was created in error - please disregard.

## 2016-04-20 ENCOUNTER — Encounter (INDEPENDENT_AMBULATORY_CARE_PROVIDER_SITE_OTHER): Payer: Self-pay

## 2016-04-20 ENCOUNTER — Ambulatory Visit: Payer: Managed Care, Other (non HMO) | Admitting: Family Medicine

## 2016-04-21 ENCOUNTER — Encounter: Payer: Self-pay | Admitting: Family Medicine

## 2016-07-13 ENCOUNTER — Ambulatory Visit (INDEPENDENT_AMBULATORY_CARE_PROVIDER_SITE_OTHER): Payer: Managed Care, Other (non HMO) | Admitting: Nurse Practitioner

## 2016-07-13 ENCOUNTER — Encounter: Payer: Self-pay | Admitting: Nurse Practitioner

## 2016-07-13 ENCOUNTER — Other Ambulatory Visit: Payer: Self-pay

## 2016-07-13 VITALS — BP 130/91 | HR 80 | Temp 97.3°F | Ht 65.0 in | Wt 162.0 lb

## 2016-07-13 DIAGNOSIS — J0101 Acute recurrent maxillary sinusitis: Secondary | ICD-10-CM | POA: Diagnosis not present

## 2016-07-13 DIAGNOSIS — F411 Generalized anxiety disorder: Secondary | ICD-10-CM

## 2016-07-13 DIAGNOSIS — J029 Acute pharyngitis, unspecified: Secondary | ICD-10-CM | POA: Diagnosis not present

## 2016-07-13 MED ORDER — FLUCONAZOLE 150 MG PO TABS: 150.0000 mg | ORAL_TABLET | Freq: Once | ORAL | Status: DC

## 2016-07-13 MED ORDER — AZITHROMYCIN 250 MG PO TABS: ORAL_TABLET | ORAL | Status: DC

## 2016-07-13 MED ORDER — ALPRAZOLAM 0.5 MG PO TABS: 0.5000 mg | ORAL_TABLET | Freq: Two times a day (BID) | ORAL | Status: DC | PRN

## 2016-07-13 NOTE — Addendum Note (Signed)
Addended by: Bennie PieriniMARTIN, MARY-MARGARET on: 07/13/2016 12:44 PM   Modules accepted: Orders, SmartSet

## 2016-07-13 NOTE — Addendum Note (Signed)
Addended by: Cleda DaubUCKER, AMANDA G on: 07/13/2016 12:48 PM   Modules accepted: Kipp BroodSmartSet

## 2016-07-13 NOTE — Progress Notes (Signed)
  Subjective:     Sarah Mcgee is a 50 y.o. female who presents for evaluation of sinus pain. Symptoms include: congestion, cough, facial pain, headaches, sinus pressure and sore throat. Onset of symptoms was 3 days ago. Symptoms have been gradually worsening since that time. Past history is significant for no history of pneumonia or bronchitis. Patient is a smoker  (1 ppd x 10 yrs).  The following portions of the patient's history were reviewed and updated as appropriate: allergies, current medications, past family history, past medical history, past social history, past surgical history and problem list.  Review of Systems Pertinent items are noted in HPI.   Objective:    BP 130/91 mmHg  Pulse 80  Temp(Src) 97.3 F (36.3 C) (Oral)  Ht 5\' 5"  (1.651 m)  Wt 162 lb (73.483 kg)  BMI 26.96 kg/m2 General appearance: alert and cooperative Eyes: conjunctivae/corneas clear. PERRL, EOM's intact. Fundi benign. Ears: normal TM's and external ear canals both ears Nose: purulent discharge, moderate congestion, turbinates red, sinus tenderness bilateral Throat: lips, mucosa, and tongue normal; teeth and gums normal Neck: no adenopathy, no carotid bruit, no JVD, supple, symmetrical, trachea midline and thyroid not enlarged, symmetric, no tenderness/mass/nodules Lungs: clear to auscultation bilaterally and dry toght cough Heart: regular rate and rhythm, S1, S2 normal, no murmur, click, rub or gallop    Assessment:    Acute bacterial sinusitis.    Plan:   1. Take meds as prescribed 2. Use a cool mist humidifier especially during the winter months and when heat has been humid. 3. Use saline nose sprays frequently 4. Saline irrigations of the nose can be very helpful if done frequently.  * 4X daily for 1 week*  * Use of a nettie pot can be helpful with this. Follow directions with this* 5. Drink plenty of fluids 6. Keep thermostat turn down low 7.For any cough or congestion  Use plain Mucinex-  regular strength or max strength is fine   * Children- consult with Pharmacist for dosing 8. For fever or aces or pains- take tylenol or ibuprofen appropriate for age and weight.  * for fevers greater than 101 orally you may alternate ibuprofen and tylenol every  3 hours.   Meds ordered this encounter  Medications  . azithromycin (ZITHROMAX) 250 MG tablet    Sig: Two tablets day one, then one tablet daily next 4 days.    Dispense:  6 tablet    Refill:  0    Order Specific Question:  Supervising Provider    Answer:  Johna SheriffVINCENT, CAROL L [[id number]]   Mary-Margaret Daphine DeutscherMartin, FNP

## 2016-07-13 NOTE — Patient Instructions (Signed)

## 2016-07-25 LAB — CULTURE, GROUP A STREP

## 2016-07-25 LAB — RAPID STREP SCREEN (MED CTR MEBANE ONLY): Strep Gp A Ag, IA W/Reflex: NEGATIVE

## 2016-09-21 ENCOUNTER — Encounter: Payer: Self-pay | Admitting: Family Medicine

## 2016-09-21 ENCOUNTER — Ambulatory Visit (INDEPENDENT_AMBULATORY_CARE_PROVIDER_SITE_OTHER): Payer: Managed Care, Other (non HMO) | Admitting: Family Medicine

## 2016-09-21 ENCOUNTER — Encounter (INDEPENDENT_AMBULATORY_CARE_PROVIDER_SITE_OTHER): Payer: Self-pay

## 2016-09-21 VITALS — BP 123/87 | HR 90 | Temp 97.7°F | Ht 65.0 in | Wt 152.8 lb

## 2016-09-21 DIAGNOSIS — F411 Generalized anxiety disorder: Secondary | ICD-10-CM | POA: Diagnosis not present

## 2016-09-21 DIAGNOSIS — R51 Headache: Secondary | ICD-10-CM | POA: Diagnosis not present

## 2016-09-21 DIAGNOSIS — R1084 Generalized abdominal pain: Secondary | ICD-10-CM

## 2016-09-21 DIAGNOSIS — R519 Headache, unspecified: Secondary | ICD-10-CM

## 2016-09-21 MED ORDER — CITALOPRAM HYDROBROMIDE 20 MG PO TABS: 20.0000 mg | ORAL_TABLET | Freq: Every day | ORAL | 3 refills | Status: DC

## 2016-09-21 MED ORDER — ALPRAZOLAM 0.5 MG PO TABS: 0.5000 mg | ORAL_TABLET | Freq: Two times a day (BID) | ORAL | 0 refills | Status: DC | PRN

## 2016-09-21 MED ORDER — PROMETHAZINE HCL 25 MG PO TABS: 12.5000 mg | ORAL_TABLET | Freq: Three times a day (TID) | ORAL | 0 refills | Status: DC | PRN

## 2016-09-21 NOTE — Patient Instructions (Signed)
Great to see you!  It is most likely that you have a bad viral infection like the flu, this should run its course in the next few days.  In the meantime please drink frequent small amounts of fluid, get plenty of rest, and eat as tolerated.  Use Tylenol for pain.  Please come back if your headache worsens, your abdominal pain worsens, or if you not able to tolerate food or fluid.

## 2016-09-21 NOTE — Progress Notes (Signed)
   HPI  Patient presents today here with acute illness and for refill Xanax.  Anxiety Does doing pretty well on Effexor, has been off of this for 2-3 months due to insurance change and she can afford it. She needs refill on Xanax. Denies suicidal thoughts.  Acute illness Describes 3 days of headache, described as holocephalic, achy in nature, and waxing and waning. It does respond to Tylenol.  For the last 2 days she's developed vomiting, diarrhea, and abdominal pain. She describes her abdominal pain as diffuse and right sided in the upper and lower quadrants crampy, and severe at times. It also comes and goes. She's had several episodes of diarrhea.  She's had 2 episodes of vomiting one day ago, she also had one episode of vomiting last night.  She is tolerating fluids today.  Her headache does have photosensitivity  PMH: Smoking status noted ROS: Per HPI  Objective: BP 123/87   Pulse 90   Temp 97.7 F (36.5 C) (Oral)   Ht _0  (1.651 m)   Wt 152 lb 12.8 oz (69.3 kg)   BMI 25.43 kg/m  Gen: NAD, alert, cooperative with exam HEENT: NCAT, moist oral mucosa CV: RRR, good S1/S2, no murmur Resp: Scattered coarse sounds throughout, good air movement, no cough during her visit Abd: Soft, positive bowel sounds, mild tenderness to palpation throughout including right upper and lower quadrants, no guarding Ext: No edema, warm Neuro: Alert and oriented, No gross deficits  Assessment and plan:  # Generalized abdominal pain Abdominal pain with nausea, vomiting, and diarrhea. With malaise and headache is likely she has a viral infection. Reassurance provided, low threshold for return Basic labs given severity of pain, however her exam is very reassuring. Phenergan   # Headache Likely due to viral syndrome, she does not have migraine headaches usually. Only a few characteristics are migrainous, believe this is most likely due to her viral syndrome. Phenergan may help,  continue Tylenol Low threshold for return  # Generalized anxiety disorder Cannot afford Effexor, it was helping. Changing to citalopram Refilled Xanax Follow-up one month   Orders Placed This Encounter  Procedures  . CMP14+EGFR  . CBC with Differential  . Lipase    Meds ordered this encounter  Medications  . ALPRAZolam (XANAX) 0.5 MG tablet    Sig: Take 1 tablet (0.5 mg total) by mouth 2 (two) times daily as needed for anxiety.    Dispense:  30 tablet    Refill:  0  . citalopram (CELEXA) 20 MG tablet    Sig: Take 1 tablet (20 mg total) by mouth daily.    Dispense:  30 tablet    Refill:  3  . promethazine (PHENERGAN) 25 MG tablet    Sig: Take 0.5-1 tablets (12.5-25 mg total) by mouth every 8 (eight) hours as needed for nausea or vomiting.    Dispense:  20 tablet    Refill:  0    Laroy Apple, MD [facility] 09/21/2016, 10:18 AM

## 2016-09-22 LAB — CBC WITH DIFFERENTIAL/PLATELET
BASOS: 0 %
Basophils Absolute: 0.1 10*3/uL (ref 0.0–0.2)
EOS (ABSOLUTE): 0.3 10*3/uL (ref 0.0–0.4)
Eos: 2 %
Hematocrit: 50.7 % — ABNORMAL HIGH (ref 34.0–46.6)
Hemoglobin: 17.6 g/dL — ABNORMAL HIGH (ref 11.1–15.9)
Immature Grans (Abs): 0 10*3/uL (ref 0.0–0.1)
Immature Granulocytes: 0 %
LYMPHS ABS: 3.4 10*3/uL — AB (ref 0.7–3.1)
Lymphs: 21 %
MCH: 32.8 pg (ref 26.6–33.0)
MCHC: 34.7 g/dL (ref 31.5–35.7)
MCV: 94 fL (ref 79–97)
MONOS ABS: 0.9 10*3/uL (ref 0.1–0.9)
Monocytes: 5 %
NEUTROS ABS: 11.9 10*3/uL — AB (ref 1.4–7.0)
Neutrophils: 72 %
PLATELETS: 307 10*3/uL (ref 150–379)
RBC: 5.37 x10E6/uL — ABNORMAL HIGH (ref 3.77–5.28)
RDW: 13.6 % (ref 12.3–15.4)
WBC: 16.5 10*3/uL — ABNORMAL HIGH (ref 3.4–10.8)

## 2016-09-22 LAB — CMP14+EGFR
A/G RATIO: 1.8 (ref 1.2–2.2)
ALT: 41 IU/L — ABNORMAL HIGH (ref 0–32)
AST: 33 IU/L (ref 0–40)
Albumin: 4.6 g/dL (ref 3.5–5.5)
Alkaline Phosphatase: 80 IU/L (ref 39–117)
BILIRUBIN TOTAL: 0.7 mg/dL (ref 0.0–1.2)
BUN/Creatinine Ratio: 16 (ref 9–23)
BUN: 11 mg/dL (ref 6–24)
CALCIUM: 10.1 mg/dL (ref 8.7–10.2)
CO2: 26 mmol/L (ref 18–29)
Chloride: 99 mmol/L (ref 96–106)
Creatinine, Ser: 0.7 mg/dL (ref 0.57–1.00)
GFR, EST AFRICAN AMERICAN: 117 mL/min/{1.73_m2} (ref >59)
GFR, EST NON AFRICAN AMERICAN: 101 mL/min/{1.73_m2} (ref >59)
GLOBULIN, TOTAL: 2.6 g/dL (ref 1.5–4.5)
Glucose: 101 mg/dL — ABNORMAL HIGH (ref 65–99)
POTASSIUM: 4.4 mmol/L (ref 3.5–5.2)
SODIUM: 140 mmol/L (ref 134–144)
TOTAL PROTEIN: 7.2 g/dL (ref 6.0–8.5)

## 2016-09-22 LAB — LIPASE: Lipase: 30 U/L (ref 0–59)

## 2017-01-09 ENCOUNTER — Encounter: Payer: Self-pay | Admitting: Family Medicine

## 2017-01-09 ENCOUNTER — Ambulatory Visit (INDEPENDENT_AMBULATORY_CARE_PROVIDER_SITE_OTHER): Payer: Managed Care, Other (non HMO) | Admitting: Family Medicine

## 2017-01-09 VITALS — BP 113/76 | HR 94 | Temp 96.8°F | Ht 65.0 in | Wt 155.2 lb

## 2017-01-09 DIAGNOSIS — J029 Acute pharyngitis, unspecified: Secondary | ICD-10-CM

## 2017-01-09 DIAGNOSIS — J111 Influenza due to unidentified influenza virus with other respiratory manifestations: Secondary | ICD-10-CM

## 2017-01-09 LAB — CULTURE, GROUP A STREP

## 2017-01-09 LAB — RAPID STREP SCREEN (MED CTR MEBANE ONLY): Strep Gp A Ag, IA W/Reflex: NEGATIVE

## 2017-01-09 MED ORDER — ALBUTEROL SULFATE HFA 108 (90 BASE) MCG/ACT IN AERS: 2.0000 | INHALATION_SPRAY | Freq: Four times a day (QID) | RESPIRATORY_TRACT | 0 refills | Status: DC | PRN

## 2017-01-09 MED ORDER — OSELTAMIVIR PHOSPHATE 75 MG PO CAPS
75.0000 mg | ORAL_CAPSULE | Freq: Two times a day (BID) | ORAL | 0 refills | Status: DC
Start: 2017-01-09 — End: 2017-02-19

## 2017-01-09 NOTE — Patient Instructions (Signed)
Great to see you!  Get plenty of fluids, rest, and take tylenol 2 tabs 3 -4 times daily as needed.    Influenza, Adult Influenza, more commonly known as "the flu," is a viral infection that primarily affects the respiratory tract. The respiratory tract includes organs that help you breathe, such as the lungs, nose, and throat. The flu causes many common cold symptoms, as well as a high fever and body aches. The flu spreads easily from person to person (is contagious). Getting a flu shot (influenza vaccination) every year is the best way to prevent influenza. What are the causes? Influenza is caused by a virus. You can catch the virus by:  Breathing in droplets from an infected person's cough or sneeze.  Touching something that was recently contaminated with the virus and then touching your mouth, nose, or eyes. What increases the risk? The following factors may make you more likely to get the flu:  Not cleaning your hands frequently with soap and water or alcohol-based hand sanitizer.  Having close contact with many people during cold and flu season.  Touching your mouth, eyes, or nose without washing or sanitizing your hands first.  Not drinking enough fluids or not eating a healthy diet.  Not getting enough sleep or exercise.  Being under a high amount of stress.  Not getting a yearly (annual) flu shot. You may be at a higher risk of complications from the flu, such as a severe lung infection (pneumonia), if you:  Are over the age of 44.  Are pregnant.  Have a weakened disease-fighting system (immune system). You may have a weakened immune system if you:  Have HIV or AIDS.  Are undergoing chemotherapy.  Aretaking medicines that reduce the activity of (suppress) the immune system.  Have a long-term (chronic) illness, such as heart disease, kidney disease, diabetes, or lung disease.  Have a liver disorder.  Are obese.  Have anemia. What are the signs or  symptoms? Symptoms of this condition typically last 4-10 days and may include:  Fever.  Chills.  Headache, body aches, or muscle aches.  Sore throat.  Cough.  Runny or congested nose.  Chest discomfort and cough.  Poor appetite.  Weakness or tiredness (fatigue).  Dizziness.  Nausea or vomiting. How is this diagnosed? This condition may be diagnosed based on your medical history and a physical exam. Your health care provider may do a nose or throat swab test to confirm the diagnosis. How is this treated? If influenza is detected early, you can be treated with antiviral medicine that can reduce the length of your illness and the severity of your symptoms. This medicine may be given by mouth (orally) or through an IV tube that is inserted in one of your veins. The goal of treatment is to relieve symptoms by taking care of yourself at home. This may include taking over-the-counter medicines, drinking plenty of fluids, and adding humidity to the air in your home. In some cases, influenza goes away on its own. Severe influenza or complications from influenza may be treated in a hospital. Follow these instructions at home:  Take over-the-counter and prescription medicines only as told by your health care provider.  Use a cool mist humidifier to add humidity to the air in your home. This can make breathing easier.  Rest as needed.  Drink enough fluid to keep your urine clear or pale yellow.  Cover your mouth and nose when you cough or sneeze.  Wash your hands with  soap and water often, especially after you cough or sneeze. If soap and water are not available, use hand sanitizer.  Stay home from work or school as told by your health care provider. Unless you are visiting your health care provider, try to avoid leaving home until your fever has been gone for 24 hours without the use of medicine.  Keep all follow-up visits as told by your health care provider. This is  important. How is this prevented?  Getting an annual flu shot is the best way to avoid getting the flu. You may get the flu shot in late summer, fall, or winter. Ask your health care provider when you should get your flu shot.  Wash your hands often or use hand sanitizer often.  Avoid contact with people who are sick during cold and flu season.  Eat a healthy diet, drink plenty of fluids, get enough sleep, and exercise regularly. Contact a health care provider if:  You develop new symptoms.  You have:  Chest pain.  Diarrhea.  A fever.  Your cough gets worse.  You produce more mucus.  You feel nauseous or you vomit. Get help right away if:  You develop shortness of breath or difficulty breathing.  Your skin or nails turn a bluish color.  You have severe pain or stiffness in your neck.  You develop a sudden headache or sudden pain in your face or ear.  You cannot stop vomiting. This information is not intended to replace advice given to you by your health care provider. Make sure you discuss any questions you have with your health care provider. Document Released: 12/08/2000 Document Revised: 05/18/2016 Document Reviewed: 10/05/2015 Elsevier Interactive Patient Education  2017 [employer]

## 2017-01-09 NOTE — Progress Notes (Signed)
   HPI  Patient presents today with influenza-like symptoms.  Patient states that she has body aches, sore throat, chills, nasal congestion, headache, mild dyspnea. This all began last night. She states that she tends to get strep throat and would like a strep throat test.  Symptoms began 24-36 hours ago  She's tolerating food and fluids normally. She states that the most severe symptoms or body aches and chills. She states she just feels like sleeping.  PMH: Smoking status noted ROS: Per HPI  Objective: BP 113/76   Pulse 94   Temp (!) 96.8 F (36 C) (Oral)   Ht 5\' 5"  (1.651 m)   Wt 155 lb 3.2 oz (70.4 kg)   BMI 25.83 kg/m  Gen: NAD, alert, cooperative with exam HEENT: NCAT, pharynx clear and moist, TMs normal bilaterally, nares clear CV: RRR, good S1/S2, no murmur Resp: nonlabored, air movement is good, scattered expiratory wheeze Ext: No edema, warm Neuro: Alert and oriented, No gross deficits  Assessment and plan:  # Influenza Clinical diagnosis Starting Tamiflu with less than 48 hours of symptoms Written for work Supportive care and usual course of illness discussed Low threshold for return  Patient wheezing on exam, no persistent rhonchi or dullness concerning for pneumonia. Given albuterol for wheezing and any dyspnea that develops  Would consider chest x-ray and/or steroids if she has persistent dyspnea.    Orders Placed This Encounter  Procedures  . [facility]  . Culture, Group A Strep    Order Specific Question:   Source    Answer:   throat  . Culture, Group A Strep    Meds ordered this encounter  Medications  . oseltamivir (TAMIFLU) 75 MG capsule    Sig: Take 1 capsule (75 mg total) by mouth 2 (two) times daily.    Dispense:  10 capsule    Refill:  0  . albuterol (PROVENTIL HFA;VENTOLIN HFA) 108 (90 Base) MCG/ACT inhaler    Sig: Inhale 2 puffs into the lungs every 6 (six) hours as needed for wheezing or shortness of breath.      Dispense:  1 Inhaler    Refill:  0    Murtis SinkSam Kewana Sanon, MD [facility] 01/09/2017, 1:12 PM

## 2017-01-12 ENCOUNTER — Other Ambulatory Visit: Payer: Self-pay | Admitting: Family Medicine

## 2017-01-12 LAB — CULTURE, GROUP A STREP

## 2017-01-12 MED ORDER — AMOXICILLIN 500 MG PO CAPS: 500.0000 mg | ORAL_CAPSULE | Freq: Three times a day (TID) | ORAL | 0 refills | Status: DC

## 2017-02-19 ENCOUNTER — Ambulatory Visit (INDEPENDENT_AMBULATORY_CARE_PROVIDER_SITE_OTHER): Payer: Managed Care, Other (non HMO)

## 2017-02-19 ENCOUNTER — Encounter: Payer: Self-pay | Admitting: Family Medicine

## 2017-02-19 ENCOUNTER — Ambulatory Visit (INDEPENDENT_AMBULATORY_CARE_PROVIDER_SITE_OTHER): Payer: Managed Care, Other (non HMO) | Admitting: Family Medicine

## 2017-02-19 VITALS — BP 125/88 | HR 101 | Temp 97.5°F | Ht 65.0 in | Wt 158.0 lb

## 2017-02-19 DIAGNOSIS — R059 Cough, unspecified: Secondary | ICD-10-CM

## 2017-02-19 DIAGNOSIS — J329 Chronic sinusitis, unspecified: Secondary | ICD-10-CM | POA: Diagnosis not present

## 2017-02-19 DIAGNOSIS — J40 Bronchitis, not specified as acute or chronic: Secondary | ICD-10-CM

## 2017-02-19 DIAGNOSIS — R05 Cough: Secondary | ICD-10-CM | POA: Diagnosis not present

## 2017-02-19 MED ORDER — FLUCONAZOLE 150 MG PO TABS: ORAL_TABLET | ORAL | 0 refills | Status: DC

## 2017-02-19 MED ORDER — LEVOFLOXACIN 500 MG PO TABS: 500.0000 mg | ORAL_TABLET | Freq: Every day | ORAL | 0 refills | Status: DC

## 2017-02-19 MED ORDER — BETAMETHASONE SOD PHOS & ACET 6 (3-3) MG/ML IJ SUSP
6.0000 mg | Freq: Once | INTRAMUSCULAR | Status: AC
  Administered 2017-02-19: 6 mg via INTRAMUSCULAR

## 2017-02-19 NOTE — Progress Notes (Signed)
Subjective:  Patient ID: Sarah Mcgee, female    DOB: 08/13/1966  Age: 51 y.o. MRN: [id number]  CC: Cough (pt here today c/o cough, congestion, sore throat, bilateral ear pain and sinus pressure/headaches x 3 days.)   HPI Sarah Mcgee presents for Symptoms include congestion, facial pain, nasal congestion, no  fever, Yellow thick productive cough, post nasal drip and sinus pressure with  chills, night sweats. Onset of symptoms was 4 days ago, gradually worsening since that time. Pt.is drinking moderate amounts of fluids.     History Sarah Mcgee has a past medical history of Anxiety; Depression; and Hyperlipidemia.   She has a past surgical history that includes Abdominal hysterectomy; Cesarean section; and arm surgery (Right).   Her family history is not on file.She reports that she quit smoking about 4 years ago. Her smoking use included Cigarettes. She has never used smokeless tobacco. She reports that she drinks alcohol. She reports that she does not use drugs.  Current Outpatient Prescriptions on File Prior to Visit  Medication Sig Dispense Refill  . albuterol (PROVENTIL HFA;VENTOLIN HFA) 108 (90 Base) MCG/ACT inhaler Inhale 2 puffs into the lungs every 6 (six) hours as needed for wheezing or shortness of breath. 1 Inhaler 0  . ALPRAZolam (XANAX) 0.5 MG tablet Take 1 tablet (0.5 mg total) by mouth 2 (two) times daily as needed for anxiety. 30 tablet 0  . atorvastatin (LIPITOR) 20 MG tablet Take 1 tablet (20 mg total) by mouth daily. 90 tablet 1  . citalopram (CELEXA) 20 MG tablet Take 1 tablet (20 mg total) by mouth daily. 30 tablet 3  . traZODone (DESYREL) 100 MG tablet Take 1 tablet (100 mg total) by mouth at bedtime. 30 tablet 3   No current facility-administered medications on file prior to visit.     ROS Review of Systems  Constitutional: Negative for activity change, appetite change, chills and fever.  HENT: Positive for congestion, ear pain, postnasal drip, rhinorrhea and sinus  pressure. Negative for ear discharge, hearing loss, nosebleeds, sneezing and trouble swallowing.   Respiratory: Positive for cough, chest tightness and shortness of breath.   Cardiovascular: Negative for chest pain and palpitations.  Skin: Negative for rash.    Objective:  BP 125/88   Pulse (!) 101   Temp 97.5 F (36.4 C) (Oral)   Ht 5\' 5"  (1.651 m)   Wt 158 lb (71.7 kg)   BMI 26.29 kg/m   Physical Exam  Constitutional: She appears well-developed and well-nourished.  HENT:  Head: Normocephalic and atraumatic.  Right Ear: Tympanic membrane and external ear normal. No decreased hearing is noted.  Left Ear: Tympanic membrane and external ear normal. No decreased hearing is noted.  Nose: Mucosal edema present. Right sinus exhibits no frontal sinus tenderness. Left sinus exhibits no frontal sinus tenderness.  Mouth/Throat: No oropharyngeal exudate or posterior oropharyngeal erythema.  Neck: No Brudzinski's sign noted.  Pulmonary/Chest: Effort normal. No respiratory distress. She has wheezes (coarse exp rhonchi, all fields).  Lymphadenopathy:       Head (right side): No preauricular adenopathy present.       Head (left side): No preauricular adenopathy present.       Right cervical: No superficial cervical adenopathy present.      Left cervical: No superficial cervical adenopathy present.    Assessment & Plan:   Sarah Mcgee was seen today for cough.  Diagnoses and all orders for this visit:  Cough -     DG Chest 2 View; Future -  betamethasone acetate-betamethasone sodium phosphate (CELESTONE) injection 6 mg; Inject 1 mL (6 mg total) into the muscle once.  Sinobronchitis -     betamethasone acetate-betamethasone sodium phosphate (CELESTONE) injection 6 mg; Inject 1 mL (6 mg total) into the muscle once.  Other orders -     levofloxacin (LEVAQUIN) 500 MG tablet; Take 1 tablet (500 mg total) by mouth daily. For 10 days   I have discontinued [name]'s  triamterene-hydrochlorothiazide, metoprolol succinate, oseltamivir, and amoxicillin. I am also having her start on levofloxacin. Additionally, I am having her maintain her atorvastatin, traZODone, ALPRAZolam, citalopram, and albuterol. We will continue to administer betamethasone acetate-betamethasone sodium phosphate.  Meds ordered this encounter  Medications  . levofloxacin (LEVAQUIN) 500 MG tablet    Sig: Take 1 tablet (500 mg total) by mouth daily. For 10 days    Dispense:  10 tablet    Refill:  0  . betamethasone acetate-betamethasone sodium phosphate (CELESTONE) injection 6 mg   chest X-ray No infiltrate noted  Follow-up: Return if symptoms worsen or fail to improve.  Mechele Claude, M.D.

## 2017-02-19 NOTE — Addendum Note (Signed)
Addended by: Margurite AuerbachOMPTON, KARLA G on: 02/19/2017 10:58 AM   Modules accepted: Orders

## 2017-10-05 ENCOUNTER — Encounter: Payer: Self-pay | Admitting: Family Medicine

## 2017-10-05 ENCOUNTER — Ambulatory Visit (INDEPENDENT_AMBULATORY_CARE_PROVIDER_SITE_OTHER): Payer: Managed Care, Other (non HMO) | Admitting: Family Medicine

## 2017-10-05 VITALS — BP 128/87 | HR 87 | Temp 97.7°F | Ht 65.0 in | Wt 160.4 lb

## 2017-10-05 DIAGNOSIS — E785 Hyperlipidemia, unspecified: Secondary | ICD-10-CM

## 2017-10-05 DIAGNOSIS — F411 Generalized anxiety disorder: Secondary | ICD-10-CM

## 2017-10-05 DIAGNOSIS — F17201 Nicotine dependence, unspecified, in remission: Secondary | ICD-10-CM

## 2017-10-05 MED ORDER — ALPRAZOLAM 0.5 MG PO TABS: 0.5000 mg | ORAL_TABLET | Freq: Two times a day (BID) | ORAL | 2 refills | Status: DC | PRN

## 2017-10-05 MED ORDER — CITALOPRAM HYDROBROMIDE 20 MG PO TABS
20.0000 mg | ORAL_TABLET | Freq: Every day | ORAL | 1 refills | Status: DC
Start: 2017-10-05 — End: 2017-10-06

## 2017-10-05 MED ORDER — ATORVASTATIN CALCIUM 20 MG PO TABS: 20.0000 mg | ORAL_TABLET | Freq: Every day | ORAL | 1 refills | Status: DC

## 2017-10-05 MED ORDER — TRAZODONE HCL 100 MG PO TABS: 100.0000 mg | ORAL_TABLET | Freq: Every day | ORAL | 1 refills | Status: DC

## 2017-10-05 NOTE — Progress Notes (Signed)
   HPI  Patient presents today to follow-up for anxiety.  Patient states she's had most very stressful time recently, her youngest son, 52 years old, has begun to use hard drugs. She quit smoking about 2 weeks ago.  She denies suicidal thoughts.  She needs refills of multiple medications which have been and 10.  Return for a physical in about 2 months.  PMH: Smoking status noted ROS: Per HPI  Objective: BP 128/87   Pulse 87   Temp 97.7 F (36.5 C) (Oral)   Ht  (1.651 m)   Wt 160 lb 6.4 oz (72.8 kg)   BMI 26.69 kg/m  Gen: NAD, alert, cooperative with exam HEENT: NCAT, EOMI, PERRL CV: RRR, good S1/S2, no murmur Resp: CTABL, no wheezes, non-labored Abd: SNTND, BS present, no guarding or organomegaly Ext: No edema, warm Neuro: Alert and oriented, No gross deficits  Assessment and plan:  # GAD DOing well, worse lately with her son's difficulty.  Refill xanax ( [place] databsase reviewed, last fill >6 months ago) Refill celexa.   # HLD Uncontrolled, not taking med but previously tolerated statin well Refill lipitor  # Tobacco use- now in remission Congratulated and discussed.    Meds ordered this encounter  Medications  . atorvastatin (LIPITOR) 20 MG tablet    Sig: Take 1 tablet (20 mg total) by mouth daily.    Dispense:  90 tablet    Refill:  1  . citalopram (CELEXA) 20 MG tablet    Sig: Take 1 tablet (20 mg total) by mouth daily.    Dispense:  90 tablet    Refill:  1  . traZODone (DESYREL) 100 MG tablet    Sig: Take 1 tablet (100 mg total) by mouth at bedtime.    Dispense:  90 tablet    Refill:  1  . ALPRAZolam (XANAX) 0.5 MG tablet    Sig: Take 1 tablet (0.5 mg total) by mouth 2 (two) times daily as needed for anxiety.    Dispense:  30 tablet    Refill:  2    Murtis Sink, MD [facility] 10/05/2017, 11:52 AM

## 2017-10-05 NOTE — Patient Instructions (Signed)
Great to see you!  Come back in 2 months or so for your physical and we will do a complete annual lab set.

## 2017-10-06 ENCOUNTER — Telehealth: Payer: Self-pay | Admitting: Family Medicine

## 2017-10-06 MED ORDER — TRAZODONE HCL 100 MG PO TABS: 100.0000 mg | ORAL_TABLET | Freq: Every day | ORAL | 1 refills | Status: DC

## 2017-10-06 MED ORDER — ATORVASTATIN CALCIUM 20 MG PO TABS: 20.0000 mg | ORAL_TABLET | Freq: Every day | ORAL | 1 refills | Status: DC

## 2017-10-06 MED ORDER — CITALOPRAM HYDROBROMIDE 20 MG PO TABS: 20.0000 mg | ORAL_TABLET | Freq: Every day | ORAL | 1 refills | Status: DC

## 2017-10-06 NOTE — Telephone Encounter (Signed)
Resent prescriptions that were sent electronically yesterday since the pharmacy was out of power when it was transmitted.

## 2017-11-29 ENCOUNTER — Ambulatory Visit (INDEPENDENT_AMBULATORY_CARE_PROVIDER_SITE_OTHER): Payer: Managed Care, Other (non HMO) | Admitting: Family Medicine

## 2017-11-29 ENCOUNTER — Encounter: Payer: Self-pay | Admitting: Family Medicine

## 2017-11-29 VITALS — BP 121/88 | HR 95 | Temp 97.2°F | Ht 65.0 in | Wt 166.6 lb

## 2017-11-29 DIAGNOSIS — R21 Rash and other nonspecific skin eruption: Secondary | ICD-10-CM

## 2017-11-29 DIAGNOSIS — Z23 Encounter for immunization: Secondary | ICD-10-CM | POA: Diagnosis not present

## 2017-11-29 DIAGNOSIS — Z0000 Encounter for general adult medical examination without abnormal findings: Secondary | ICD-10-CM | POA: Diagnosis not present

## 2017-11-29 DIAGNOSIS — F411 Generalized anxiety disorder: Secondary | ICD-10-CM

## 2017-11-29 DIAGNOSIS — E785 Hyperlipidemia, unspecified: Secondary | ICD-10-CM

## 2017-11-29 MED ORDER — ALBUTEROL SULFATE HFA 108 (90 BASE) MCG/ACT IN AERS: 2.0000 | INHALATION_SPRAY | Freq: Four times a day (QID) | RESPIRATORY_TRACT | 0 refills | Status: DC | PRN

## 2017-11-29 MED ORDER — ALPRAZOLAM 0.5 MG PO TABS: 0.5000 mg | ORAL_TABLET | Freq: Two times a day (BID) | ORAL | 2 refills | Status: DC | PRN

## 2017-11-29 MED ORDER — DESONIDE 0.05 % EX CREA: TOPICAL_CREAM | Freq: Two times a day (BID) | CUTANEOUS | 0 refills | Status: DC

## 2017-11-29 NOTE — Patient Instructions (Signed)
Great to see you!  Come back in 3 months unless you need [place] sooner.   Health Maintenance, Female Adopting a healthy lifestyle and getting preventive care can go a long way to promote health and wellness. Talk with your health care provider about what schedule of regular examinations is right for you. This is a good chance for you to check in with your provider about disease prevention and staying healthy. In between checkups, there are plenty of things you can do on your own. Experts have done a lot of research about which lifestyle changes and preventive measures are most likely to keep you healthy. Ask your health care provider for more information. Weight and diet Eat a healthy diet  Be sure to include plenty of vegetables, fruits, low-fat dairy products, and lean protein.  Do not eat a lot of foods high in solid fats, added sugars, or salt.  Get regular exercise. This is one of the most important things you can do for your health. ? Most adults should exercise for at least 150 minutes each week. The exercise should increase your heart rate and make you sweat (moderate-intensity exercise). ? Most adults should also do strengthening exercises at least twice a week. This is in addition to the moderate-intensity exercise.  Maintain a healthy weight  Body mass index (BMI) is a measurement that can be used to identify possible weight problems. It estimates body fat based on height and weight. Your health care provider can help determine your BMI and help you achieve or maintain a healthy weight.  For females 35 years of age and older: ? A BMI below 18.5 is considered underweight. ? A BMI of 18.5 to 24.9 is normal. ? A BMI of 25 to 29.9 is considered overweight. ? A BMI of 30 and above is considered obese.  Watch levels of cholesterol and blood lipids  You should start having your blood tested for lipids and cholesterol at 52 years of age, then have this test every 5 years.  You may need  to have your cholesterol levels checked more often if: ? Your lipid or cholesterol levels are high. ? You are older than 52 years of age. ? You are at high risk for heart disease.  Cancer screening Lung Cancer  Lung cancer screening is recommended for adults 35-81 years old who are at high risk for lung cancer because of a history of smoking.  A yearly low-dose CT scan of the lungs is recommended for people who: ? Currently smoke. ? Have quit within the past 15 years. ? Have at least a 30-pack-year history of smoking. A pack year is smoking an average of one pack of cigarettes a day for 1 year.  Yearly screening should continue until it has been 15 years since you quit.  Yearly screening should stop if you develop a health problem that would prevent you from having lung cancer treatment.  Breast Cancer  Practice breast self-awareness. This means understanding how your breasts normally appear and feel.  It also means doing regular breast self-exams. Let your health care provider know about any changes, no matter how small.  If you are in your 20s or 30s, you should have a clinical breast exam (CBE) by a health care provider every 1-3 years as part of a regular health exam.  If you are 67 or older, have a CBE every year. Also consider having a breast X-ray (mammogram) every year.  If you have a family history of breast  cancer, talk to your health care provider about genetic screening.  If you are at high risk for breast cancer, talk to your health care provider about having an MRI and a mammogram every year.  Breast cancer gene (BRCA) assessment is recommended for women who have family members with BRCA-related cancers. BRCA-related cancers include: ? Breast. ? Ovarian. ? Tubal. ? Peritoneal cancers.  Results of the assessment will determine the need for genetic counseling and BRCA1 and BRCA2 testing.  Cervical Cancer Your health care provider may recommend that you be  screened regularly for cancer of the pelvic organs (ovaries, uterus, and vagina). This screening involves a pelvic examination, including checking for microscopic changes to the surface of your cervix (Pap test). You may be encouraged to have this screening done every 3 years, beginning at age 35.  For women ages 30-65, health care providers may recommend pelvic exams and Pap testing every 3 years, or they may recommend the Pap and pelvic exam, combined with testing for human papilloma virus (HPV), every 5 years. Some types of HPV increase your risk of cervical cancer. Testing for HPV may also be done on women of any age with unclear Pap test results.  Other health care providers may not recommend any screening for nonpregnant women who are considered low risk for pelvic cancer and who do not have symptoms. Ask your health care provider if a screening pelvic exam is right for you.  If you have had past treatment for cervical cancer or a condition that could lead to cancer, you need Pap tests and screening for cancer for at least 20 years after your treatment. If Pap tests have been discontinued, your risk factors (such as having a new sexual partner) need to be reassessed to determine if screening should resume. Some women have medical problems that increase the chance of getting cervical cancer. In these cases, your health care provider may recommend more frequent screening and Pap tests.  Colorectal Cancer  This type of cancer can be detected and often prevented.  Routine colorectal cancer screening usually begins at 52 years of age and continues through 52 years of age.  Your health care provider may recommend screening at an earlier age if you have risk factors for colon cancer.  Your health care provider may also recommend using home test kits to check for hidden blood in the stool.  A small camera at the end of a tube can be used to examine your colon directly (sigmoidoscopy or colonoscopy).  This is done to check for the earliest forms of colorectal cancer.  Routine screening usually begins at age 45.  Direct examination of the colon should be repeated every 5-10 years through 52 years of age. However, you may need to be screened more often if early forms of precancerous polyps or small growths are found.  Skin Cancer  Check your skin from head to toe regularly.  Tell your health care provider about any new moles or changes in moles, especially if there is a change in a mole's shape or color.  Also tell your health care provider if you have a mole that is larger than the size of a pencil eraser.  Always use sunscreen. Apply sunscreen liberally and repeatedly throughout the day.  Protect yourself by wearing long sleeves, pants, a wide-brimmed hat, and sunglasses whenever you are outside.  Heart disease, diabetes, and high blood pressure  High blood pressure causes heart disease and increases the risk of stroke. High blood pressure  is more likely to develop in: ? People who have blood pressure in the high end of the normal range (130-139/85-89 mm Hg). ? People who are overweight or obese. ? People who are African American.  If you are 71-68 years of age, have your blood pressure checked every 3-5 years. If you are 27 years of age or older, have your blood pressure checked every year. You should have your blood pressure measured twice-once when you are at a hospital or clinic, and once when you are not at a hospital or clinic. Record the average of the two measurements. To check your blood pressure when you are not at a hospital or clinic, you can use: ? An automated blood pressure machine at a pharmacy. ? A home blood pressure monitor.  If you are between 3 years and 42 years old, ask your health care provider if you should take aspirin to prevent strokes.  Have regular diabetes screenings. This involves taking a blood sample to check your fasting blood sugar level. ? If  you are at a normal weight and have a low risk for diabetes, have this test once every three years after 52 years of age. ? If you are overweight and have a high risk for diabetes, consider being tested at a younger age or more often. Preventing infection Hepatitis B  If you have a higher risk for hepatitis B, you should be screened for this virus. You are considered at high risk for hepatitis B if: ? You were born in a country where hepatitis B is common. Ask your health care provider which countries are considered high risk. ? Your parents were born in a high-risk country, and you have not been immunized against hepatitis B (hepatitis B vaccine). ? You have HIV or AIDS. ? You use needles to inject street drugs. ? You live with someone who has hepatitis B. ? You have had sex with someone who has hepatitis B. ? You get hemodialysis treatment. ? You take certain medicines for conditions, including cancer, organ transplantation, and autoimmune conditions.  Hepatitis C  Blood testing is recommended for: ? Everyone born from [birth year] through 1966. ? Anyone with known risk factors for hepatitis C.  Sexually transmitted infections (STIs)  You should be screened for sexually transmitted infections (STIs) including gonorrhea and chlamydia if: ? You are sexually active and are younger than 52 years of age. ? You are older than 52 years of age and your health care provider tells you that you are at risk for this type of infection. ? Your sexual activity has changed since you were last screened and you are at an increased risk for chlamydia or gonorrhea. Ask your health care provider if you are at risk.  If you do not have HIV, but are at risk, it may be recommended that you take a prescription medicine daily to prevent HIV infection. This is called pre-exposure prophylaxis (PrEP). You are considered at risk if: ? You are sexually active and do not regularly use condoms or know the HIV status of your  partner(s). ? You take drugs by injection. ? You are sexually active with a partner who has HIV.  Talk with your health care provider about whether you are at high risk of being infected with HIV. If you choose to begin PrEP, you should first be tested for HIV. You should then be tested every 3 months for as long as you are taking PrEP. Pregnancy  If you are premenopausal and  you may become pregnant, ask your health care provider about preconception counseling.  If you may become pregnant, take 400 to 800 micrograms (mcg) of folic acid every day.  If you want to prevent pregnancy, talk to your health care provider about birth control (contraception). Osteoporosis and menopause  Osteoporosis is a disease in which the bones lose minerals and strength with aging. This can result in serious bone fractures. Your risk for osteoporosis can be identified using a bone density scan.  If you are 59 years of age or older, or if you are at risk for osteoporosis and fractures, ask your health care provider if you should be screened.  Ask your health care provider whether you should take a calcium or vitamin D supplement to lower your risk for osteoporosis.  Menopause may have certain physical symptoms and risks.  Hormone replacement therapy may reduce some of these symptoms and risks. Talk to your health care provider about whether hormone replacement therapy is right for you. Follow these instructions at home:  Schedule regular health, dental, and eye exams.  Stay current with your immunizations.  Do not use any tobacco products including cigarettes, chewing tobacco, or electronic cigarettes.  If you are pregnant, do not drink alcohol.  If you are breastfeeding, limit how much and how often you drink alcohol.  Limit alcohol intake to no more than 1 drink per day for nonpregnant women. One drink equals 12 ounces of beer, 5 ounces of wine, or 1 ounces of hard liquor.  Do not use street  drugs.  Do not share needles.  Ask your health care provider for help if you need support or information about quitting drugs.  Tell your health care provider if you often feel depressed.  Tell your health care provider if you have ever been abused or do not feel safe at home. This information is not intended to replace advice given to you by your health care provider. Make sure you discuss any questions you have with your health care provider. Document Released: 06/26/2011 Document Revised: 05/18/2016 Document Reviewed: 09/14/2015 Elsevier Interactive Patient Education  [employer].

## 2017-11-29 NOTE — Addendum Note (Signed)
Addended by: Elenora GammaBRADSHAW, SAMUEL L on: 11/29/2017 10:34 AM   Modules accepted: Orders

## 2017-11-29 NOTE — Addendum Note (Signed)
Addended by: Lorelee CoverOSTOSKY, JESSICA C on: 11/29/2017 10:43 AM   Modules accepted: Orders

## 2017-11-29 NOTE — Progress Notes (Addendum)
   HPI  Patient presents today for follow-up of medical conditions as well as annual physical exam.  Physical exam Patient feels very well and is doing well.  She has no complaints.  She needs a refill on Xanax. She stopped smoking about 3 months ago. Her breathing is better.  Hyperlipidemia Doing well with Lipitor. Fasting today  Itchy facial rash has been going on for several months, however worse over the last few weeks. Has seen dermatology and was told either atopic dermatitis or adult acne.    PMH: Smoking status noted ROS: Per HPI  Objective: BP 121/88   Pulse 95   Temp (!) 97.2 F (36.2 C) (Oral)   Ht '5\' 5"'$  (1.651 m)   Wt 166 lb 9.6 oz (75.6 kg)   BMI 27.72 kg/m  Gen: NAD, alert, cooperative with exam HEENT: NCAT, oropharynx moist and clear, TMs normal bilaterally CV: RRR, good S1/S2, no murmur Resp: CTABL, no wheezes, non-labored Abd: SNTND, BS present, no guarding or organomegaly Ext: No edema, warm Neuro: Alert and oriented Pelvic: Normal appearing female perineum, no cervix well visualized with speculum exam, none palpated with bimanual, no adnexal masses  Facial rash not well-visualized with makeup present  Assessment and plan:  #Physical exam Normal exam She has had a hysterectomy, she has never been informed that she does not have a cervix, it was at least difficult to visualize and palpate on exam today. Request records from GYN  #Hyperlipidemia Labs today, continue statin Patient has stopped smoking, congratulated  Rash Trial of desonide, most likely atopic dermatitis given pruritic nature  Counseling provided for all vaccines and their components   Orders Placed This Encounter  Procedures  . CMP14+EGFR  . CBC with Differential/Platelet  . Lipid panel  . TSH    Meds ordered this encounter  Medications  . ALPRAZolam (XANAX) 0.5 MG tablet    Sig: Take 1 tablet (0.5 mg total) by mouth 2 (two) times daily as needed for anxiety.   Dispense:  30 tablet    Refill:  2  . albuterol (PROVENTIL HFA;VENTOLIN HFA) 108 (90 Base) MCG/ACT inhaler    Sig: Inhale 2 puffs into the lungs every 6 (six) hours as needed for wheezing or shortness of breath.    Dispense:  1 Inhaler    Refill:  0    Laroy Apple, MD [facility] 11/29/2017, 10:28 AM

## 2017-11-29 NOTE — Addendum Note (Signed)
Addended by: Lorelee CoverOSTOSKY, Dylann Gallier C on: 11/29/2017 10:50 AM   Modules accepted: Orders

## 2017-11-30 LAB — CBC WITH DIFFERENTIAL/PLATELET
BASOS ABS: 0 10*3/uL (ref 0.0–0.2)
BASOS: 0 %
EOS (ABSOLUTE): 0.3 10*3/uL (ref 0.0–0.4)
Eos: 3 %
HEMATOCRIT: 42.3 % (ref 34.0–46.6)
HEMOGLOBIN: 14.2 g/dL (ref 11.1–15.9)
IMMATURE GRANULOCYTES: 0 %
Immature Grans (Abs): 0 10*3/uL (ref 0.0–0.1)
LYMPHS: 31 %
Lymphocytes Absolute: 2.7 10*3/uL (ref 0.7–3.1)
MCH: 31.6 pg (ref 26.6–33.0)
MCHC: 33.6 g/dL (ref 31.5–35.7)
MCV: 94 fL (ref 79–97)
Monocytes Absolute: 0.6 10*3/uL (ref 0.1–0.9)
Monocytes: 7 %
NEUTROS ABS: 5.2 10*3/uL (ref 1.4–7.0)
Neutrophils: 59 %
Platelets: 312 10*3/uL (ref 150–379)
RBC: 4.49 x10E6/uL (ref 3.77–5.28)
RDW: 13.4 % (ref 12.3–15.4)
WBC: 8.8 10*3/uL (ref 3.4–10.8)

## 2017-11-30 LAB — TSH: TSH: 1.99 u[IU]/mL (ref 0.450–4.500)

## 2017-11-30 LAB — CMP14+EGFR
ALT: 36 IU/L — ABNORMAL HIGH (ref 0–32)
AST: 30 IU/L (ref 0–40)
Albumin/Globulin Ratio: 2.1 (ref 1.2–2.2)
Albumin: 4.8 g/dL (ref 3.5–5.5)
Alkaline Phosphatase: 75 IU/L (ref 39–117)
BUN/Creatinine Ratio: 18 (ref 9–23)
BUN: 15 mg/dL (ref 6–24)
Bilirubin Total: 0.5 mg/dL (ref 0.0–1.2)
CALCIUM: 9.8 mg/dL (ref 8.7–10.2)
CO2: 27 mmol/L (ref 20–29)
CREATININE: 0.82 mg/dL (ref 0.57–1.00)
Chloride: 101 mmol/L (ref 96–106)
GFR calc Af Amer: 95 mL/min/{1.73_m2} (ref >59)
GFR, EST NON AFRICAN AMERICAN: 83 mL/min/{1.73_m2} (ref >59)
GLOBULIN, TOTAL: 2.3 g/dL (ref 1.5–4.5)
Glucose: 93 mg/dL (ref 65–99)
POTASSIUM: 4.3 mmol/L (ref 3.5–5.2)
SODIUM: 142 mmol/L (ref 134–144)
Total Protein: 7.1 g/dL (ref 6.0–8.5)

## 2017-11-30 LAB — LIPID PANEL
CHOLESTEROL TOTAL: 165 mg/dL (ref 100–199)
Chol/HDL Ratio: 2.6 ratio (ref 0.0–4.4)
HDL: 63 mg/dL (ref >39)
LDL Calculated: 83 mg/dL (ref 0–99)
TRIGLYCERIDES: 97 mg/dL (ref 0–149)
VLDL Cholesterol Cal: 19 mg/dL (ref 5–40)

## 2017-12-03 LAB — PAP IG W/ RFLX HPV ASCU: PAP Smear Comment: 0

## 2017-12-26 ENCOUNTER — Other Ambulatory Visit: Payer: Self-pay | Admitting: Family Medicine

## 2018-03-29 ENCOUNTER — Ambulatory Visit (INDEPENDENT_AMBULATORY_CARE_PROVIDER_SITE_OTHER): Payer: Managed Care, Other (non HMO) | Admitting: *Deleted

## 2018-03-29 DIAGNOSIS — Z23 Encounter for immunization: Secondary | ICD-10-CM

## 2018-03-29 NOTE — Progress Notes (Signed)
Pt given shingrix vaccine Tolerated well 

## 2018-04-29 ENCOUNTER — Other Ambulatory Visit: Payer: Self-pay | Admitting: Family Medicine

## 2018-04-29 NOTE — Telephone Encounter (Signed)
OV 05/03/18 

## 2018-05-03 ENCOUNTER — Encounter: Payer: Self-pay | Admitting: Family Medicine

## 2018-05-03 ENCOUNTER — Ambulatory Visit (INDEPENDENT_AMBULATORY_CARE_PROVIDER_SITE_OTHER): Payer: Managed Care, Other (non HMO) | Admitting: Family Medicine

## 2018-05-03 ENCOUNTER — Ambulatory Visit (INDEPENDENT_AMBULATORY_CARE_PROVIDER_SITE_OTHER): Payer: Managed Care, Other (non HMO)

## 2018-05-03 VITALS — BP 131/94 | HR 96 | Temp 99.0°F | Ht 65.0 in | Wt 169.6 lb

## 2018-05-03 DIAGNOSIS — M25562 Pain in left knee: Secondary | ICD-10-CM

## 2018-05-03 DIAGNOSIS — M67449 Ganglion, unspecified hand: Secondary | ICD-10-CM | POA: Diagnosis not present

## 2018-05-03 DIAGNOSIS — M25561 Pain in right knee: Secondary | ICD-10-CM | POA: Diagnosis not present

## 2018-05-03 DIAGNOSIS — F411 Generalized anxiety disorder: Secondary | ICD-10-CM

## 2018-05-03 MED ORDER — GABAPENTIN 300 MG PO CAPS: 300.0000 mg | ORAL_CAPSULE | Freq: Three times a day (TID) | ORAL | 3 refills | Status: DC

## 2018-05-03 MED ORDER — CITALOPRAM HYDROBROMIDE 40 MG PO TABS: 40.0000 mg | ORAL_TABLET | Freq: Every day | ORAL | 3 refills | Status: DC

## 2018-05-03 MED ORDER — ALPRAZOLAM 0.5 MG PO TABS: 0.5000 mg | ORAL_TABLET | Freq: Two times a day (BID) | ORAL | 2 refills | Status: DC | PRN

## 2018-05-03 NOTE — Progress Notes (Signed)
   HPI  Patient presents today follow-up for chronic medical conditions.  Anxiety Patient feels well has had increased stress recently. She states that she does pretty well with 1/2 pill of Xanax twice daily but does think that she would benefit from a little bit more. She would like to increase Celexa.  Patient has a painful lesion on the left second finger that is present for about 1 month.  She can push it down and will completely go away in the refill.  Patient complains of about 1 month of bilateral aching knees.  She states that it is worse at night, however she is cognizant of it throughout the day as well. She denies injuring, locking, or popping symptoms.  PMH: Smoking status noted ROS: Per HPI  Objective: BP (!) 131/94   Pulse 96   Temp 99 F (37.2 C) (Oral)   Ht  (1.651 m)   Wt 169 lb 9.6 oz (76.9 kg)   BMI 28.22 kg/m  Gen: NAD, alert, cooperative with exam HEENT: NCAT CV: RRR, good S1/S2, no murmur Resp: CTABL, no wheezes, non-labored Ext: No edema, warm Neuro: Alert and oriented, No gross deficits MSK: BL knee with mild medial joint line tenderness.  ligamentously intact to Lachman's and with varus and valgus stress.  Negative McMurray's test   Assessment and plan:  #GAD Titrate Celexa to 40 mg Increase Xanax slightly to 45 pills/month Adding gabapentin, see below  #Bilateral knee pain Patient with symmetric aching knee pain, also with intermittent hip pain Consider fibromyalgia Plain film to evaluate for OA Start gabapentin  #Ganglion cyst of finger Likely ganglion cyst, irritated Refer to hand surgery      Meds ordered this encounter  Medications  . citalopram (CELEXA) 40 MG tablet    Sig: Take 1 tablet (40 mg total) by mouth daily.    Dispense:  90 tablet    Refill:  3  . gabapentin (NEURONTIN) 300 MG capsule    Sig: Take 1 capsule (300 mg total) by mouth 3 (three) times daily.    Dispense:  90 capsule    Refill:  3  .  ALPRAZolam (XANAX) 0.5 MG tablet    Sig: Take 1 tablet (0.5 mg total) by mouth 2 (two) times daily as needed for anxiety.    Dispense:  45 tablet    Refill:  2    Murtis Sink, MD [facility] 05/03/2018, 4:16 PM

## 2018-06-26 ENCOUNTER — Encounter: Payer: Self-pay | Admitting: Physician Assistant

## 2018-06-26 ENCOUNTER — Ambulatory Visit (INDEPENDENT_AMBULATORY_CARE_PROVIDER_SITE_OTHER): Payer: 59 | Admitting: Physician Assistant

## 2018-06-26 VITALS — BP 128/88 | HR 96 | Temp 98.2°F | Ht 65.0 in | Wt 170.0 lb

## 2018-06-26 DIAGNOSIS — M2560 Stiffness of unspecified joint, not elsewhere classified: Secondary | ICD-10-CM | POA: Diagnosis not present

## 2018-06-26 DIAGNOSIS — M2550 Pain in unspecified joint: Secondary | ICD-10-CM

## 2018-06-26 DIAGNOSIS — R5382 Chronic fatigue, unspecified: Secondary | ICD-10-CM

## 2018-06-26 MED ORDER — PREDNISONE 10 MG (48) PO TBPK: ORAL_TABLET | ORAL | 0 refills | Status: DC

## 2018-06-30 NOTE — Progress Notes (Signed)
BP 128/88   Pulse 96   Temp 98.2 F (36.8 C) (Oral)   Ht 5\' 5"  (1.651 m)   Wt 170 lb (77.1 kg)   BMI 28.29 kg/m    Subjective:    Patient ID: Sarah Mcgee, female    DOB: 10/21/1966, 52 y.o.   MRN: [id number]  HPI: Sarah Mcgee is a 52 y.o. female presenting on 06/26/2018 for Knee Pain and all over body pain  This patient comes in with multiple complaints.  She has significant stiffness of her joints.  Particularly after sitting.  She has had a history of a tick bite.  The joints that are most bothersome in her hands, ankles, knees.  The stiffness in the morning can last up to 2 hours.  There is some family history of arthritis since.  She has had problems in the past with hypertension, depression, and anxiety.  Past Medical History:  Diagnosis Date  . Anxiety   . Depression   . Hyperlipidemia    Relevant past medical, surgical, family and social history reviewed and updated as indicated. Interim medical history since our last visit reviewed. Allergies and medications reviewed and updated. DATA REVIEWED: CHART IN EPIC  Family History reviewed for pertinent findings.  Review of Systems  Constitutional: Positive for fatigue. Negative for activity change, fever and unexpected weight change.  HENT: Negative.   Eyes: Negative.   Respiratory: Negative.  Negative for cough.   Cardiovascular: Negative.  Negative for chest pain.  Gastrointestinal: Negative.  Negative for abdominal pain.  Endocrine: Negative.   Genitourinary: Negative.  Negative for dysuria.  Musculoskeletal: Positive for arthralgias, back pain, gait problem, joint swelling, myalgias, neck pain and neck stiffness.  Skin: Negative.     Allergies as of 06/26/2018      Reactions   Codeine       Medication List        Accurate as of 06/26/18 11:59 PM. Always use your most recent med list.          albuterol 108 (90 Base) MCG/ACT inhaler Commonly known as:  PROVENTIL HFA;VENTOLIN HFA TAKE 2 PUFFS BY MOUTH EVERY  6 HOURS AS NEEDED FOR WHEEZE OR SHORTNESS OF BREATH   ALPRAZolam 0.5 MG tablet Commonly known as:  XANAX Take 1 tablet (0.5 mg total) by mouth 2 (two) times daily as needed for anxiety.   atorvastatin 20 MG tablet Commonly known as:  LIPITOR TAKE 1 TABLET BY MOUTH EVERY DAY   citalopram 40 MG tablet Commonly known as:  CELEXA Take 1 tablet (40 mg total) by mouth daily.   desonide 0.05 % cream Commonly known as:  DESOWEN Apply topically 2 (two) times daily.   gabapentin 300 MG capsule Commonly known as:  NEURONTIN Take 1 capsule (300 mg total) by mouth 3 (three) times daily.   predniSONE 10 MG (48) Tbpk tablet Commonly known as:  STERAPRED UNI-PAK 48 TAB Take as directed   traZODone 100 MG tablet Commonly known as:  DESYREL TAKE 1 TABLET BY MOUTH EVERYDAY AT BEDTIME          Objective:    BP 128/88   Pulse 96   Temp 98.2 F (36.8 C) (Oral)   Ht 5\' 5"  (1.651 m)   Wt 170 lb (77.1 kg)   BMI 28.29 kg/m   Allergies  Allergen Reactions  . Codeine     Wt Readings from Last 3 Encounters:  06/26/18 170 lb (77.1 kg)  05/03/18 169 lb 9.6 oz (76.9 kg)  11/29/17 166 lb 9.6 oz (75.6 kg)    Physical Exam  Constitutional: She is oriented to person, place, and time. She appears well-developed and well-nourished.  HENT:  Head: Normocephalic and atraumatic.  Eyes: Pupils are equal, round, and reactive to light. Conjunctivae and EOM are normal.  Cardiovascular: Normal rate, regular rhythm, normal heart sounds and intact distal pulses.  Pulmonary/Chest: Effort normal and breath sounds normal.  Abdominal: Soft. Bowel sounds are normal.  Neurological: She is alert and oriented to person, place, and time. She has normal reflexes.  Skin: Skin is warm and dry. No rash noted.  Psychiatric: She has a normal mood and affect. Her behavior is normal. Judgment and thought content normal.    Results for orders placed or performed in visit on 06/26/18  Thyroid Panel With TSH  Result  Value Ref Range   TSH 2.860 0.450 - 4.500 uIU/mL   T4, Total 4.6 4.5 - 12.0 ug/dL   T3 Uptake Ratio 26 24 - 39 %   Free Thyroxine Index 1.2 1.2 - 4.9  Arthritis Panel  Result Value Ref Range   Uric Acid 6.0 2.5 - 7.1 mg/dL   Rhuematoid fact SerPl-aCnc <10.0 0.0 - 13.9 IU/mL   WBC 6.9 3.4 - 10.8 x10E3/uL   RBC 4.22 3.77 - 5.28 x10E6/uL   Hemoglobin 13.3 11.1 - 15.9 g/dL   Hematocrit 19.139.6 47.834.0 - 46.6 %   MCV 94 79 - 97 fL   MCH 31.5 26.6 - 33.0 pg   MCHC 33.6 31.5 - 35.7 g/dL   RDW 29.514.4 62.112.3 - 30.815.4 %   Platelets 320 150 - 450 x10E3/uL   Neutrophils 62 Not Estab. %   Lymphs 31 Not Estab. %   Monocytes 5 Not Estab. %   Eos 2 Not Estab. %   Basos 0 Not Estab. %   Neutrophils Absolute 4.3 1.4 - 7.0 x10E3/uL   Lymphocytes Absolute 2.1 0.7 - 3.1 x10E3/uL   Monocytes Absolute 0.4 0.1 - 0.9 x10E3/uL   EOS (ABSOLUTE) 0.2 0.0 - 0.4 x10E3/uL   Basophils Absolute 0.0 0.0 - 0.2 x10E3/uL   Immature Granulocytes 0 Not Estab. %   Immature Grans (Abs) 0.0 0.0 - 0.1 x10E3/uL   Sed Rate 2 0 - 40 mm/hr  ANA,IFA RA Diag Pnl w/rflx Tit/Patn  Result Value Ref Range   ANA Titer 1 WILL FOLLOW    Cyclic Citrullin Peptide Ab 4 0 - 19 units  Lyme Ab/Western Blot Reflex  Result Value Ref Range   Lyme IgG/IgM Ab <0.91 0.00 - 0.90 ISR   LYME DISEASE AB, QUANT, IGM <0.80 0.00 - 0.79 index      Assessment & Plan:   1. Joint stiffness - Thyroid Panel With TSH - Arthritis Panel - ANA,IFA RA Diag Pnl w/rflx Tit/Patn - Lyme Ab/Western Blot Reflex  2. Arthralgia, unspecified joint - Thyroid Panel With TSH - Arthritis Panel - ANA,IFA RA Diag Pnl w/rflx Tit/Patn - Lyme Ab/Western Blot Reflex  3. Chronic fatigue - Thyroid Panel With TSH - Arthritis Panel - ANA,IFA RA Diag Pnl w/rflx Tit/Patn - Lyme Ab/Western Blot Reflex   Continue all other maintenance medications as listed above.  Follow up plan: No follow-ups on file.  Educational handout given for survey  Remus LofflerAngel S. Taleeya Blondin PA-C [facility] [address]  [place], [place] [id number] [phone]   06/30/2018, 10:13 PM

## 2018-07-01 ENCOUNTER — Telehealth: Payer: Self-pay | Admitting: Physician Assistant

## 2018-07-01 NOTE — Telephone Encounter (Signed)
Patient aware of results.

## 2018-07-02 LAB — ARTHRITIS PANEL
BASOS ABS: 0 10*3/uL (ref 0.0–0.2)
Basos: 0 %
EOS (ABSOLUTE): 0.2 10*3/uL (ref 0.0–0.4)
Eos: 2 %
HEMOGLOBIN: 13.3 g/dL (ref 11.1–15.9)
Hematocrit: 39.6 % (ref 34.0–46.6)
IMMATURE GRANS (ABS): 0 10*3/uL (ref 0.0–0.1)
Immature Granulocytes: 0 %
LYMPHS: 31 %
Lymphocytes Absolute: 2.1 10*3/uL (ref 0.7–3.1)
MCH: 31.5 pg (ref 26.6–33.0)
MCHC: 33.6 g/dL (ref 31.5–35.7)
MCV: 94 fL (ref 79–97)
Monocytes Absolute: 0.4 10*3/uL (ref 0.1–0.9)
Monocytes: 5 %
NEUTROS ABS: 4.3 10*3/uL (ref 1.4–7.0)
Neutrophils: 62 %
PLATELETS: 320 10*3/uL (ref 150–450)
RBC: 4.22 x10E6/uL (ref 3.77–5.28)
RDW: 14.4 % (ref 12.3–15.4)
Rhuematoid fact SerPl-aCnc: <10 IU/mL (ref 0.0–13.9)
Sed Rate: 2 mm/hr (ref 0–40)
URIC ACID: 6 mg/dL (ref 2.5–7.1)
WBC: 6.9 10*3/uL (ref 3.4–10.8)

## 2018-07-02 LAB — THYROID PANEL WITH TSH
Free Thyroxine Index: 1.2 (ref 1.2–4.9)
T3 Uptake Ratio: 26 % (ref 24–39)
T4, Total: 4.6 ug/dL (ref 4.5–12.0)
TSH: 2.86 u[IU]/mL (ref 0.450–4.500)

## 2018-07-02 LAB — LYME AB/WESTERN BLOT REFLEX: LYME DISEASE AB, QUANT, IGM: <0.8 index (ref 0.00–0.79)

## 2018-07-02 LAB — ANA,IFA RA DIAG PNL W/RFLX TIT/PATN
ANA Titer 1: NEGATIVE
Cyclic Citrullin Peptide Ab: 4 units (ref 0–19)

## 2018-07-09 ENCOUNTER — Encounter: Payer: Self-pay | Admitting: Physician Assistant

## 2018-07-09 ENCOUNTER — Ambulatory Visit (INDEPENDENT_AMBULATORY_CARE_PROVIDER_SITE_OTHER): Payer: 59 | Admitting: Physician Assistant

## 2018-07-09 VITALS — BP 131/88 | HR 81 | Temp 97.9°F | Ht 65.0 in | Wt 169.4 lb

## 2018-07-09 DIAGNOSIS — A084 Viral intestinal infection, unspecified: Secondary | ICD-10-CM | POA: Diagnosis not present

## 2018-07-09 MED ORDER — ONDANSETRON 8 MG PO TBDP: 8.0000 mg | ORAL_TABLET | Freq: Three times a day (TID) | ORAL | 1 refills | Status: DC | PRN

## 2018-07-09 NOTE — Progress Notes (Signed)
BP 131/88   Pulse 81   Temp 97.9 F (36.6 C) (Oral)   Ht 5\' 5"  (1.651 m)   Wt 169 lb 6.4 oz (76.8 kg)   BMI 28.19 kg/m    Subjective:    Patient ID: Sarah Mcgee, female    DOB: 1966-10-14, 52 y.o.   MRN: [id number]  HPI: Sarah Mcgee is a 52 y.o. female presenting on 07/09/2018 for Abdominal Pain; Diarrhea; and Nausea  This patient comes in with 3 day history of nausea and vomiting.  In the beginning nausea and vomiting were the only symptoms and halfway through more diarrhea.  Last time the patient ate was this AM, some broth Positive exposure to others with gastroenteritis Denies fever. Denies blood.   Past Medical History:  Diagnosis Date  . Anxiety   . Depression   . Hyperlipidemia    Relevant past medical, surgical, family and social history reviewed and updated as indicated. Interim medical history since our last visit reviewed. Allergies and medications reviewed and updated. DATA REVIEWED: CHART IN EPIC  Family History reviewed for pertinent findings.  Review of Systems  Constitutional: Positive for fatigue. Negative for fever.  HENT: Negative.   Eyes: Negative.   Respiratory: Negative.   Gastrointestinal: Positive for abdominal pain, diarrhea, nausea and vomiting.  Genitourinary: Negative.     Allergies as of 07/09/2018      Reactions   Codeine       Medication List        Accurate as of 07/09/18  2:38 PM. Always use your most recent med list.          albuterol 108 (90 Base) MCG/ACT inhaler Commonly known as:  PROVENTIL HFA;VENTOLIN HFA TAKE 2 PUFFS BY MOUTH EVERY 6 HOURS AS NEEDED FOR WHEEZE OR SHORTNESS OF BREATH   ALPRAZolam 0.5 MG tablet Commonly known as:  XANAX Take 1 tablet (0.5 mg total) by mouth 2 (two) times daily as needed for anxiety.   atorvastatin 20 MG tablet Commonly known as:  LIPITOR TAKE 1 TABLET BY MOUTH EVERY DAY   citalopram 40 MG tablet Commonly known as:  CELEXA Take 1 tablet (40 mg total) by mouth daily.     desonide 0.05 % cream Commonly known as:  DESOWEN Apply topically 2 (two) times daily.   gabapentin 300 MG capsule Commonly known as:  NEURONTIN Take 1 capsule (300 mg total) by mouth 3 (three) times daily.   ondansetron 8 MG disintegrating tablet Commonly known as:  ZOFRAN ODT Take 1 tablet (8 mg total) by mouth every 8 (eight) hours as needed for nausea or vomiting.   traZODone 100 MG tablet Commonly known as:  DESYREL TAKE 1 TABLET BY MOUTH EVERYDAY AT BEDTIME          Objective:    BP 131/88   Pulse 81   Temp 97.9 F (36.6 C) (Oral)   Ht 5\' 5"  (1.651 m)   Wt 169 lb 6.4 oz (76.8 kg)   BMI 28.19 kg/m   Allergies  Allergen Reactions  . Codeine     Wt Readings from Last 3 Encounters:  07/09/18 169 lb 6.4 oz (76.8 kg)  06/26/18 170 lb (77.1 kg)  05/03/18 169 lb 9.6 oz (76.9 kg)    Physical Exam  Constitutional: She is oriented to person, place, and time. She appears well-developed and well-nourished.  HENT:  Head: Normocephalic and atraumatic.  Eyes: Pupils are equal, round, and reactive to light. Conjunctivae and EOM are normal.  Cardiovascular: Normal  rate, regular rhythm, normal heart sounds and intact distal pulses.  Pulmonary/Chest: Effort normal and breath sounds normal.  Abdominal: Soft. She exhibits no distension and no mass. Bowel sounds are increased. There is tenderness in the right upper quadrant, epigastric area and left upper quadrant. There is no rebound and no guarding.  Neurological: She is alert and oriented to person, place, and time. She has normal reflexes.  Skin: Skin is warm and dry. No rash noted.  Psychiatric: She has a normal mood and affect. Her behavior is normal. Judgment and thought content normal.    Results for orders placed or performed in visit on 06/26/18  Thyroid Panel With TSH  Result Value Ref Range   TSH 2.860 0.450 - 4.500 uIU/mL   T4, Total 4.6 4.5 - 12.0 ug/dL   T3 Uptake Ratio 26 24 - 39 %   Free Thyroxine Index  1.2 1.2 - 4.9  Arthritis Panel  Result Value Ref Range   Uric Acid 6.0 2.5 - 7.1 mg/dL   Rhuematoid fact SerPl-aCnc <10.0 0.0 - 13.9 IU/mL   WBC 6.9 3.4 - 10.8 x10E3/uL   RBC 4.22 3.77 - 5.28 x10E6/uL   Hemoglobin 13.3 11.1 - 15.9 g/dL   Hematocrit 40.939.6 81.134.0 - 46.6 %   MCV 94 79 - 97 fL   MCH 31.5 26.6 - 33.0 pg   MCHC 33.6 31.5 - 35.7 g/dL   RDW 91.414.4 78.212.3 - 95.615.4 %   Platelets 320 150 - 450 x10E3/uL   Neutrophils 62 Not Estab. %   Lymphs 31 Not Estab. %   Monocytes 5 Not Estab. %   Eos 2 Not Estab. %   Basos 0 Not Estab. %   Neutrophils Absolute 4.3 1.4 - 7.0 x10E3/uL   Lymphocytes Absolute 2.1 0.7 - 3.1 x10E3/uL   Monocytes Absolute 0.4 0.1 - 0.9 x10E3/uL   EOS (ABSOLUTE) 0.2 0.0 - 0.4 x10E3/uL   Basophils Absolute 0.0 0.0 - 0.2 x10E3/uL   Immature Granulocytes 0 Not Estab. %   Immature Grans (Abs) 0.0 0.0 - 0.1 x10E3/uL   Sed Rate 2 0 - 40 mm/hr  ANA,IFA RA Diag Pnl w/rflx Tit/Patn  Result Value Ref Range   ANA Titer 1 Negative    Cyclic Citrullin Peptide Ab 4 0 - 19 units  Lyme Ab/Western Blot Reflex  Result Value Ref Range   Lyme IgG/IgM Ab <0.91 0.00 - 0.90 ISR   LYME DISEASE AB, QUANT, IGM <0.80 0.00 - 0.79 index      Assessment & Plan:   1. Viral gastroenteritis - ondansetron (ZOFRAN ODT) 8 MG disintegrating tablet; Take 1 tablet (8 mg total) by mouth every 8 (eight) hours as needed for nausea or vomiting.  Dispense: 20 tablet; Refill: 1   Continue all other maintenance medications as listed above.  Follow up plan: No follow-ups on file.  Educational handout given for survey  Remus LofflerAngel S. Othal Kubitz PA-C [facility] [address]  [place], [place] [id number] [phone]   07/09/2018, 2:38 PM

## 2018-07-15 ENCOUNTER — Ambulatory Visit: Payer: 59 | Admitting: Physician Assistant

## 2018-08-20 ENCOUNTER — Other Ambulatory Visit: Payer: Self-pay | Admitting: *Deleted

## 2018-08-20 MED ORDER — ATORVASTATIN CALCIUM 20 MG PO TABS: 20.0000 mg | ORAL_TABLET | Freq: Every day | ORAL | 0 refills | Status: DC

## 2018-10-11 ENCOUNTER — Other Ambulatory Visit: Payer: Self-pay | Admitting: *Deleted

## 2018-10-11 ENCOUNTER — Other Ambulatory Visit: Payer: Self-pay | Admitting: Physician Assistant

## 2018-10-11 DIAGNOSIS — F411 Generalized anxiety disorder: Secondary | ICD-10-CM

## 2018-10-11 MED ORDER — ALPRAZOLAM 0.5 MG PO TABS: 0.5000 mg | ORAL_TABLET | Freq: Two times a day (BID) | ORAL | 0 refills | Status: DC | PRN

## 2018-10-11 NOTE — Telephone Encounter (Signed)
Patient aware.

## 2018-10-11 NOTE — Telephone Encounter (Signed)
Yes, will authorize this last one, then will ned follow up

## 2018-10-11 NOTE — Telephone Encounter (Signed)
Fax from [facility] Pt has 1 RF left on the Alprazolam Rx which was written by Dr. Ermalinda Memos Will you authorize the last refill on this Rx Please advise

## 2018-11-05 ENCOUNTER — Other Ambulatory Visit: Payer: Self-pay | Admitting: *Deleted

## 2018-11-05 ENCOUNTER — Other Ambulatory Visit: Payer: Self-pay | Admitting: Physician Assistant

## 2018-11-05 MED ORDER — GABAPENTIN 300 MG PO CAPS: 300.0000 mg | ORAL_CAPSULE | Freq: Three times a day (TID) | ORAL | 0 refills | Status: DC

## 2018-12-10 ENCOUNTER — Other Ambulatory Visit: Payer: Self-pay | Admitting: Physician Assistant

## 2018-12-11 NOTE — Telephone Encounter (Signed)
Jones. NTBS 30 days given 11/05/18

## 2018-12-11 NOTE — Telephone Encounter (Signed)
Pt scheduled with Prudy FeelerAngel Jones 01/01/19 at 3:25.

## 2019-01-01 ENCOUNTER — Encounter: Payer: 59 | Admitting: Physician Assistant

## 2019-01-21 ENCOUNTER — Ambulatory Visit (INDEPENDENT_AMBULATORY_CARE_PROVIDER_SITE_OTHER): Payer: 59 | Admitting: Family

## 2019-01-21 ENCOUNTER — Encounter: Payer: Self-pay | Admitting: Family

## 2019-01-21 VITALS — BP 127/88 | HR 87 | Temp 98.4°F | Ht 65.0 in | Wt 169.8 lb

## 2019-01-21 DIAGNOSIS — R6889 Other general symptoms and signs: Secondary | ICD-10-CM | POA: Diagnosis not present

## 2019-01-21 DIAGNOSIS — A084 Viral intestinal infection, unspecified: Secondary | ICD-10-CM | POA: Diagnosis not present

## 2019-01-21 LAB — CULTURE, GROUP A STREP

## 2019-01-21 LAB — VERITOR FLU A/B WAIVED
INFLUENZA A: NEGATIVE
INFLUENZA B: NEGATIVE

## 2019-01-21 LAB — RAPID STREP SCREEN (MED CTR MEBANE ONLY): STREP GP A AG, IA W/REFLEX: NEGATIVE

## 2019-01-21 MED ORDER — ONDANSETRON HCL 4 MG PO TABS: 4.0000 mg | ORAL_TABLET | Freq: Three times a day (TID) | ORAL | 0 refills | Status: DC | PRN

## 2019-01-21 NOTE — Progress Notes (Signed)
   Subjective:    Patient ID: Sarah Mcgee, female    DOB: Feb 08, 1966, 53 y.o.   MRN: [id number]  Chief Complaint  Patient presents with  . Emesis  . Headache  . glands under jaw sore    Emesis   This is a new problem. The current episode started in the past 7 days. The problem occurs 2 to 4 times per day. The problem has been gradually improving. The emesis has an appearance of stomach contents. Maximum temperature: 99. Associated symptoms include arthralgias, chills, diarrhea, dizziness, a fever, headaches and myalgias. Pertinent negatives include no coughing. She has tried bed rest for the symptoms. The treatment provided mild relief.  Headache   Associated symptoms include dizziness, a fever and vomiting. Pertinent negatives include no coughing.      Review of Systems  Constitutional: Positive for chills and fever.  Respiratory: Negative for cough.   Gastrointestinal: Positive for diarrhea and vomiting.  Musculoskeletal: Positive for arthralgias and myalgias.  Neurological: Positive for dizziness and headaches.  All other systems reviewed and are negative.      Objective:   Physical Exam Vitals signs reviewed.  Constitutional:      General: She is not in acute distress.    Appearance: She is well-developed. She is ill-appearing.  HENT:     Head: Normocephalic and atraumatic.     Right Ear: External ear normal.  Neck:     Thyroid: No thyromegaly.  Cardiovascular:     Rate and Rhythm: Normal rate and regular rhythm.     Heart sounds: Normal heart sounds. No murmur.  Pulmonary:     Effort: Pulmonary effort is normal. No respiratory distress.     Breath sounds: Normal breath sounds. No wheezing.  Abdominal:     General: Bowel sounds are normal. There is no distension.     Palpations: Abdomen is soft.     Tenderness: There is no abdominal tenderness.  Musculoskeletal: Normal range of motion.        General: No tenderness.  Skin:    General: Skin is warm and dry.    Neurological:     Mental Status: She is alert and oriented to person, place, and time.     Cranial Nerves: No cranial nerve deficit.     Deep Tendon Reflexes: Reflexes are normal and symmetric.  Psychiatric:        Behavior: Behavior normal.        Thought Content: Thought content normal.        Judgment: Judgment normal.     BP 127/88   Pulse 87   Temp 98.4 F (36.9 C) (Oral)   Ht 5\' 5"  (1.651 m)   Wt 169 lb 12.8 oz (77 kg)   BMI 28.26 kg/m      Assessment & Plan:  Sarah Mcgee comes in today with chief complaint of Emesis; Headache; and glands under jaw sore   Diagnosis and orders addressed:  1. Flu-like symptoms - Rapid Strep Screen ([facility] ONLY) - Veritor Flu A/B Waived  2. Viral gastroenteritis Rest Bland diet Force fluids RTO if symptoms worsen or do not improve  - ondansetron (ZOFRAN) 4 MG tablet; Take 1 tablet (4 mg total) by mouth every 8 (eight) hours as needed for nausea or vomiting.  Dispense: 20 tablet; Refill: 0   Jannifer Rodney, FNP

## 2019-01-21 NOTE — Patient Instructions (Signed)
Viral Gastroenteritis, Adult    Viral gastroenteritis is also known as the stomach flu. This condition is caused by various viruses. These viruses can be passed from person to person very easily (are very contagious). This condition may affect your stomach, small intestine, and large intestine. It can cause sudden watery diarrhea, fever, and vomiting.  Diarrhea and vomiting can make you feel weak and cause you to become dehydrated. You may not be able to keep fluids down. Dehydration can make you tired and thirsty, cause you to have a dry mouth, and decrease how often you urinate. Older adults and people with other diseases or a weak immune system are at higher risk for dehydration.  It is important to replace the fluids that you lose from diarrhea and vomiting. If you become severely dehydrated, you may need to get fluids through an IV tube.  What are the causes?  Gastroenteritis is caused by various viruses, including rotavirus and norovirus. Norovirus is the most common cause in adults.  You can get sick by eating food, drinking water, or touching a surface contaminated with one of these viruses. You can also get sick from sharing utensils or other personal items with an infected person.  What increases the risk?  This condition is more likely to develop in people:  · Who have a weak defense system (immune system).  · Who live with one or more children who are younger than 2 years old.  · Who live in a nursing home.  · Who go on cruise ships.  What are the signs or symptoms?  Symptoms of this condition start suddenly 1-2 days after exposure to a virus. Symptoms may last a few days or as long as a week. The most common symptoms are watery diarrhea and vomiting. Other symptoms include:  · Fever.  · Headache.  · Fatigue.  · Pain in the abdomen.  · Chills.  · Weakness.  · Nausea.  · Muscle aches.  · Loss of appetite.  How is this diagnosed?  This condition is diagnosed with a medical history and physical exam. You  may also have a stool test to check for viruses or other infections.  How is this treated?  This condition typically goes away on its own. The focus of treatment is to restore lost fluids (rehydration). Your health care provider may recommend that you take an oral rehydration solution (ORS) to replace important salts and minerals (electrolytes) in your body. Severe cases of this condition may require giving fluids through an IV tube.  Treatment may also include medicine to help with your symptoms.  Follow these instructions at home:    Follow instructions from your health care provider about how to care for yourself at home.  Follow these recommendations as told by your health care provider:  · Take an ORS. This is a drink that is sold at pharmacies and retail stores.  · Drink clear fluids in small amounts as you are able. Clear fluids include water, ice chips, diluted fruit juice, and low-calorie sports drinks.  · Eat bland, easy-to-digest foods in small amounts as you are able. These foods include bananas, applesauce, rice, lean meats, toast, and crackers.  · Avoid fluids that contain a lot of sugar or caffeine, such as energy drinks, sports drinks, and soda.  · Avoid alcohol.  · Avoid spicy or fatty foods.  General instructions  · Drink enough fluid to keep your urine clear or pale yellow.  · Wash your hands often.   If soap and water are not available, use hand sanitizer.  · Make sure that all people in your household wash their hands well and often.  · Take over-the-counter and prescription medicines only as told by your health care provider.  · Rest at home while you recover.  · Watch your condition for any changes.  · Take a warm bath to relieve any burning or pain from frequent diarrhea episodes.  · Keep all follow-up visits as told by your health care provider. This is important.  Contact a health care provider if:  · You cannot keep fluids down.  · Your symptoms get worse.  · You have new symptoms.  · You  feel light-headed or dizzy.  · You have muscle cramps.  Get help right away if:  · You have chest pain.  · You feel extremely weak or you faint.  · You see blood in your vomit.  · Your vomit looks like coffee grounds.  · You have bloody or black stools or stools that look like tar.  · You have a severe headache, a stiff neck, or both.  · You have a rash.  · You have severe pain, cramping, or bloating in your abdomen.  · You have trouble breathing or you are breathing very quickly.  · Your heart is beating very quickly.  · Your skin feels cold and clammy.  · You feel confused.  · You have pain when you urinate.  · You have signs of dehydration, such as:  ? Dark urine, very little urine, or no urine.  ? Cracked lips.  ? Dry mouth.  ? Sunken eyes.  ? Sleepiness.  ? Weakness.  This information is not intended to replace advice given to you by your health care provider. Make sure you discuss any questions you have with your health care provider.  Document Released: 12/11/2005 Document Revised: 07/26/2017 Document Reviewed: 08/17/2015  Elsevier Interactive Patient Education © 2019 [employer] Inc[employer]

## 2019-01-22 ENCOUNTER — Encounter: Payer: 59 | Admitting: Physician Assistant

## 2019-01-31 ENCOUNTER — Encounter: Payer: 59 | Admitting: Physician Assistant

## 2019-02-07 ENCOUNTER — Ambulatory Visit (INDEPENDENT_AMBULATORY_CARE_PROVIDER_SITE_OTHER): Payer: 59 | Admitting: Physician Assistant

## 2019-02-07 ENCOUNTER — Encounter: Payer: Self-pay | Admitting: Physician Assistant

## 2019-02-07 VITALS — BP 127/83 | HR 94 | Ht 65.0 in | Wt 171.2 lb

## 2019-02-07 DIAGNOSIS — E785 Hyperlipidemia, unspecified: Secondary | ICD-10-CM | POA: Diagnosis not present

## 2019-02-07 DIAGNOSIS — F411 Generalized anxiety disorder: Secondary | ICD-10-CM | POA: Diagnosis not present

## 2019-02-07 DIAGNOSIS — Z0000 Encounter for general adult medical examination without abnormal findings: Secondary | ICD-10-CM

## 2019-02-07 DIAGNOSIS — I10 Essential (primary) hypertension: Secondary | ICD-10-CM

## 2019-02-07 DIAGNOSIS — Z0001 Encounter for general adult medical examination with abnormal findings: Secondary | ICD-10-CM | POA: Diagnosis not present

## 2019-02-07 MED ORDER — CITALOPRAM HYDROBROMIDE 40 MG PO TABS: 40.0000 mg | ORAL_TABLET | Freq: Every day | ORAL | 3 refills | Status: DC

## 2019-02-07 MED ORDER — METOPROLOL SUCCINATE ER 25 MG PO TB24: 12.5000 mg | ORAL_TABLET | Freq: Every day | ORAL | 11 refills | Status: DC

## 2019-02-07 MED ORDER — GABAPENTIN 300 MG PO CAPS: 300.0000 mg | ORAL_CAPSULE | Freq: Three times a day (TID) | ORAL | 3 refills | Status: DC

## 2019-02-07 MED ORDER — TRAZODONE HCL 100 MG PO TABS: ORAL_TABLET | ORAL | 2 refills | Status: DC

## 2019-02-07 MED ORDER — ALPRAZOLAM 0.5 MG PO TABS: 0.5000 mg | ORAL_TABLET | Freq: Two times a day (BID) | ORAL | 1 refills | Status: DC | PRN

## 2019-02-07 MED ORDER — DESONIDE 0.05 % EX CREA: TOPICAL_CREAM | Freq: Two times a day (BID) | CUTANEOUS | 5 refills | Status: DC

## 2019-02-07 MED ORDER — ALBUTEROL SULFATE HFA 108 (90 BASE) MCG/ACT IN AERS: INHALATION_SPRAY | RESPIRATORY_TRACT | 2 refills | Status: DC

## 2019-02-07 MED ORDER — ATORVASTATIN CALCIUM 20 MG PO TABS: 20.0000 mg | ORAL_TABLET | Freq: Every day | ORAL | 3 refills | Status: DC

## 2019-02-07 NOTE — Patient Instructions (Signed)

## 2019-02-08 ENCOUNTER — Other Ambulatory Visit: Payer: 59

## 2019-02-08 ENCOUNTER — Encounter (INDEPENDENT_AMBULATORY_CARE_PROVIDER_SITE_OTHER): Payer: Self-pay

## 2019-02-08 DIAGNOSIS — Z0000 Encounter for general adult medical examination without abnormal findings: Secondary | ICD-10-CM

## 2019-02-09 LAB — LIPID PANEL
Chol/HDL Ratio: 5.2 ratio — ABNORMAL HIGH (ref 0.0–4.4)
Cholesterol, Total: 239 mg/dL — ABNORMAL HIGH (ref 100–199)
HDL: 46 mg/dL (ref >39)
LDL Calculated: 146 mg/dL — ABNORMAL HIGH (ref 0–99)
Triglycerides: 236 mg/dL — ABNORMAL HIGH (ref 0–149)
VLDL CHOLESTEROL CAL: 47 mg/dL — AB (ref 5–40)

## 2019-02-09 LAB — CBC WITH DIFFERENTIAL/PLATELET
Basophils Absolute: 0.1 10*3/uL (ref 0.0–0.2)
Basos: 1 %
EOS (ABSOLUTE): 0.2 10*3/uL (ref 0.0–0.4)
Eos: 2 %
HEMATOCRIT: 45.7 % (ref 34.0–46.6)
Hemoglobin: 15.8 g/dL (ref 11.1–15.9)
Immature Grans (Abs): 0.1 10*3/uL (ref 0.0–0.1)
Immature Granulocytes: 1 %
Lymphocytes Absolute: 2.6 10*3/uL (ref 0.7–3.1)
Lymphs: 24 %
MCH: 31.3 pg (ref 26.6–33.0)
MCHC: 34.6 g/dL (ref 31.5–35.7)
MCV: 91 fL (ref 79–97)
MONOS ABS: 0.5 10*3/uL (ref 0.1–0.9)
Monocytes: 4 %
Neutrophils Absolute: 7.4 10*3/uL — ABNORMAL HIGH (ref 1.4–7.0)
Neutrophils: 68 %
Platelets: 420 10*3/uL (ref 150–450)
RBC: 5.05 x10E6/uL (ref 3.77–5.28)
RDW: 13.2 % (ref 11.7–15.4)
WBC: 10.8 10*3/uL (ref 3.4–10.8)

## 2019-02-09 LAB — CMP14+EGFR
ALK PHOS: 88 IU/L (ref 39–117)
ALT: 25 IU/L (ref 0–32)
AST: 23 IU/L (ref 0–40)
Albumin/Globulin Ratio: 2.1 (ref 1.2–2.2)
Albumin: 5 g/dL — ABNORMAL HIGH (ref 3.8–4.9)
BUN/Creatinine Ratio: 15 (ref 9–23)
BUN: 10 mg/dL (ref 6–24)
Bilirubin Total: 0.2 mg/dL (ref 0.0–1.2)
CO2: 23 mmol/L (ref 20–29)
Calcium: 9.7 mg/dL (ref 8.7–10.2)
Chloride: 100 mmol/L (ref 96–106)
Creatinine, Ser: 0.66 mg/dL (ref 0.57–1.00)
GFR calc Af Amer: 117 mL/min/{1.73_m2} (ref >59)
GFR calc non Af Amer: 101 mL/min/{1.73_m2} (ref >59)
Globulin, Total: 2.4 g/dL (ref 1.5–4.5)
Glucose: 85 mg/dL (ref 65–99)
Potassium: 4.9 mmol/L (ref 3.5–5.2)
Sodium: 141 mmol/L (ref 134–144)
Total Protein: 7.4 g/dL (ref 6.0–8.5)

## 2019-02-09 LAB — TSH: TSH: 1.39 u[IU]/mL (ref 0.450–4.500)

## 2019-02-09 NOTE — Progress Notes (Signed)
BP 127/83   Pulse 94   Ht '5\' 5"'  (1.651 m)   Wt 171 lb 3.2 oz (77.7 kg)   BMI 28.49 kg/m    Subjective:    Patient ID: Sarah Mcgee, female    DOB: 1966-02-20, 53 y.o.   MRN: [id number]  HPI: Sarah Mcgee is a 53 y.o. female presenting on 02/07/2019 for Annual Exam  This patient comes in for annual well physical examination. All medications are reviewed today. There are no reports of any problems with the medications. All of the medical conditions are reviewed and updated.  Lab work is reviewed and will be ordered as medically necessary. There are no new problems reported with today's visit.  Patient reports doing well overall.  This patient comes in for periodic recheck on medications and conditions including hyperlipid, hypertension, GAD.   All medications are reviewed today. There are no reports of any problems with the medications. All of the medical conditions are reviewed and updated.  Lab work is reviewed and will be ordered as medically necessary. There are no new problems reported with today's visit.   Past Medical History:  Diagnosis Date  . Anxiety   . Depression   . Hyperlipidemia    Relevant past medical, surgical, family and social history reviewed and updated as indicated. Interim medical history since our last visit reviewed. Allergies and medications reviewed and updated. DATA REVIEWED: CHART IN EPIC  Family History reviewed for pertinent findings.  Review of Systems  Constitutional: Negative.  Negative for activity change, fatigue and fever.  HENT: Negative.   Eyes: Negative.   Respiratory: Negative.  Negative for cough.   Cardiovascular: Negative.  Negative for chest pain.  Gastrointestinal: Negative.  Negative for abdominal pain.  Endocrine: Negative.   Genitourinary: Negative.  Negative for dysuria.  Musculoskeletal: Negative.   Skin: Negative.   Neurological: Negative.     Allergies as of 02/07/2019      Reactions   Codeine       Medication List        Accurate as of February 07, 2019 11:59 PM. Always use your most recent med list.        albuterol 108 (90 Base) MCG/ACT inhaler Commonly known as:  PROVENTIL HFA;VENTOLIN HFA TAKE 2 PUFFS BY MOUTH EVERY 6 HOURS AS NEEDED FOR WHEEZE OR SHORTNESS OF BREATH   ALPRAZolam 0.5 MG tablet Commonly known as:  XANAX Take 1 tablet (0.5 mg total) by mouth 2 (two) times daily as needed for anxiety.   atorvastatin 20 MG tablet Commonly known as:  LIPITOR Take 1 tablet (20 mg total) by mouth daily. (Needs to be seen before next refill)   citalopram 40 MG tablet Commonly known as:  CELEXA Take 1 tablet (40 mg total) by mouth daily.   desonide 0.05 % cream Commonly known as:  DESOWEN Apply topically 2 (two) times daily.   gabapentin 300 MG capsule Commonly known as:  NEURONTIN Take 1 capsule (300 mg total) by mouth 3 (three) times daily. (Needs to be seen before next refill)   metoprolol succinate 25 MG 24 hr tablet Commonly known as:  TOPROL XL Take 0.5 tablets (12.5 mg total) by mouth daily.   ondansetron 4 MG tablet Commonly known as:  ZOFRAN Take 1 tablet (4 mg total) by mouth every 8 (eight) hours as needed for nausea or vomiting.   ondansetron 8 MG disintegrating tablet Commonly known as:  ZOFRAN ODT Take 1 tablet (8 mg total) by mouth every 8 (  eight) hours as needed for nausea or vomiting.   traZODone 100 MG tablet Commonly known as:  DESYREL TAKE 1 TABLET BY MOUTH EVERYDAY AT BEDTIME          Objective:    BP 127/83   Pulse 94   Ht '5\' 5"'  (1.651 m)   Wt 171 lb 3.2 oz (77.7 kg)   BMI 28.49 kg/m   Allergies  Allergen Reactions  . Codeine     Wt Readings from Last 3 Encounters:  02/07/19 171 lb 3.2 oz (77.7 kg)  01/21/19 169 lb 12.8 oz (77 kg)  07/09/18 169 lb 6.4 oz (76.8 kg)    Physical Exam Constitutional:      Appearance: She is well-developed.  HENT:     Head: Normocephalic and atraumatic.  Eyes:     Conjunctiva/sclera: Conjunctivae normal.      Pupils: Pupils are equal, round, and reactive to light.  Neck:     Musculoskeletal: Normal range of motion and neck supple.  Cardiovascular:     Rate and Rhythm: Normal rate and regular rhythm.     Heart sounds: Normal heart sounds.  Pulmonary:     Effort: Pulmonary effort is normal.     Breath sounds: Normal breath sounds.  Chest:     Breasts: Breasts are symmetrical.        Right: No mass, skin change or tenderness.        Left: No mass, skin change or tenderness.  Abdominal:     General: Bowel sounds are normal.     Palpations: Abdomen is soft.  Genitourinary:    Labia:        Right: No tenderness or lesion.        Left: No tenderness or lesion.      Vagina: Normal. No vaginal discharge, tenderness or bleeding.     Cervix: No cervical motion tenderness, discharge or friability.     Uterus: Not deviated, not enlarged and not tender.      Adnexa:        Right: No mass, tenderness or fullness.         Left: No mass, tenderness or fullness.       Rectum: No anal fissure.  Skin:    General: Skin is warm and dry.     Findings: No rash.  Neurological:     Mental Status: She is alert and oriented to person, place, and time.     Deep Tendon Reflexes: Reflexes are normal and symmetric.  Psychiatric:        Behavior: Behavior normal.        Thought Content: Thought content normal.        Judgment: Judgment normal.     Results for orders placed or performed in visit on 01/21/19  Rapid Strep Screen ([facility] ONLY)  Result Value Ref Range   Strep Gp A Ag, IA W/Reflex Negative Negative  Culture, Group A Strep  Result Value Ref Range   Strep A Culture CANCELED   Veritor Flu A/B Waived  Result Value Ref Range   Influenza A Negative Negative   Influenza B Negative Negative      Assessment & Plan:   1. Well adult exam - CBC with Differential/Platelet; Future - CMP14+EGFR; Future - Lipid panel; Future - TSH; Future - Pap IG w/ reflex to HPV when ASC-U  2.  Generalized anxiety disorder - ALPRAZolam (XANAX) 0.5 MG tablet; Take 1 tablet (0.5 mg total) by mouth 2 (two)  times daily as needed for anxiety.  Dispense: 45 tablet; Refill: 1  3. Hyperlipidemia, unspecified hyperlipidemia type - atorvastatin (LIPITOR) 20 MG tablet; Take 1 tablet (20 mg total) by mouth daily. (Needs to be seen before next refill)  Dispense: 90 tablet; Refill: 3  4. Essential hypertension - metoprolol succinate (TOPROL XL) 25 MG 24 hr tablet; Take 0.5 tablets (12.5 mg total) by mouth daily.  Dispense: 30 tablet; Refill: 11   Continue all other maintenance medications as listed above.  Follow up plan: Return in about 6 months (around 08/08/2019).  Educational handout given for [place] PA-C [facility] [address]  [place], [place] [address] [phone]   02/09/2019, 9:53 PM

## 2019-02-11 LAB — PAP IG W/ RFLX HPV ASCU

## 2019-02-21 LAB — HM MAMMOGRAPHY

## 2019-06-09 ENCOUNTER — Other Ambulatory Visit: Payer: Self-pay | Admitting: Physician Assistant

## 2019-06-11 ENCOUNTER — Other Ambulatory Visit: Payer: Self-pay | Admitting: *Deleted

## 2019-09-11 IMAGING — DX DG KNEE STANDING AP BILAT
3 series · 3 of 3 positions shown · non-contrast
Comparison: None.

CLINICAL DATA: Acute bilateral knee pain. No known injury. Initial
encounter.

EXAM:
BILATERAL KNEES STANDING - 1 VIEW

[knee ap]
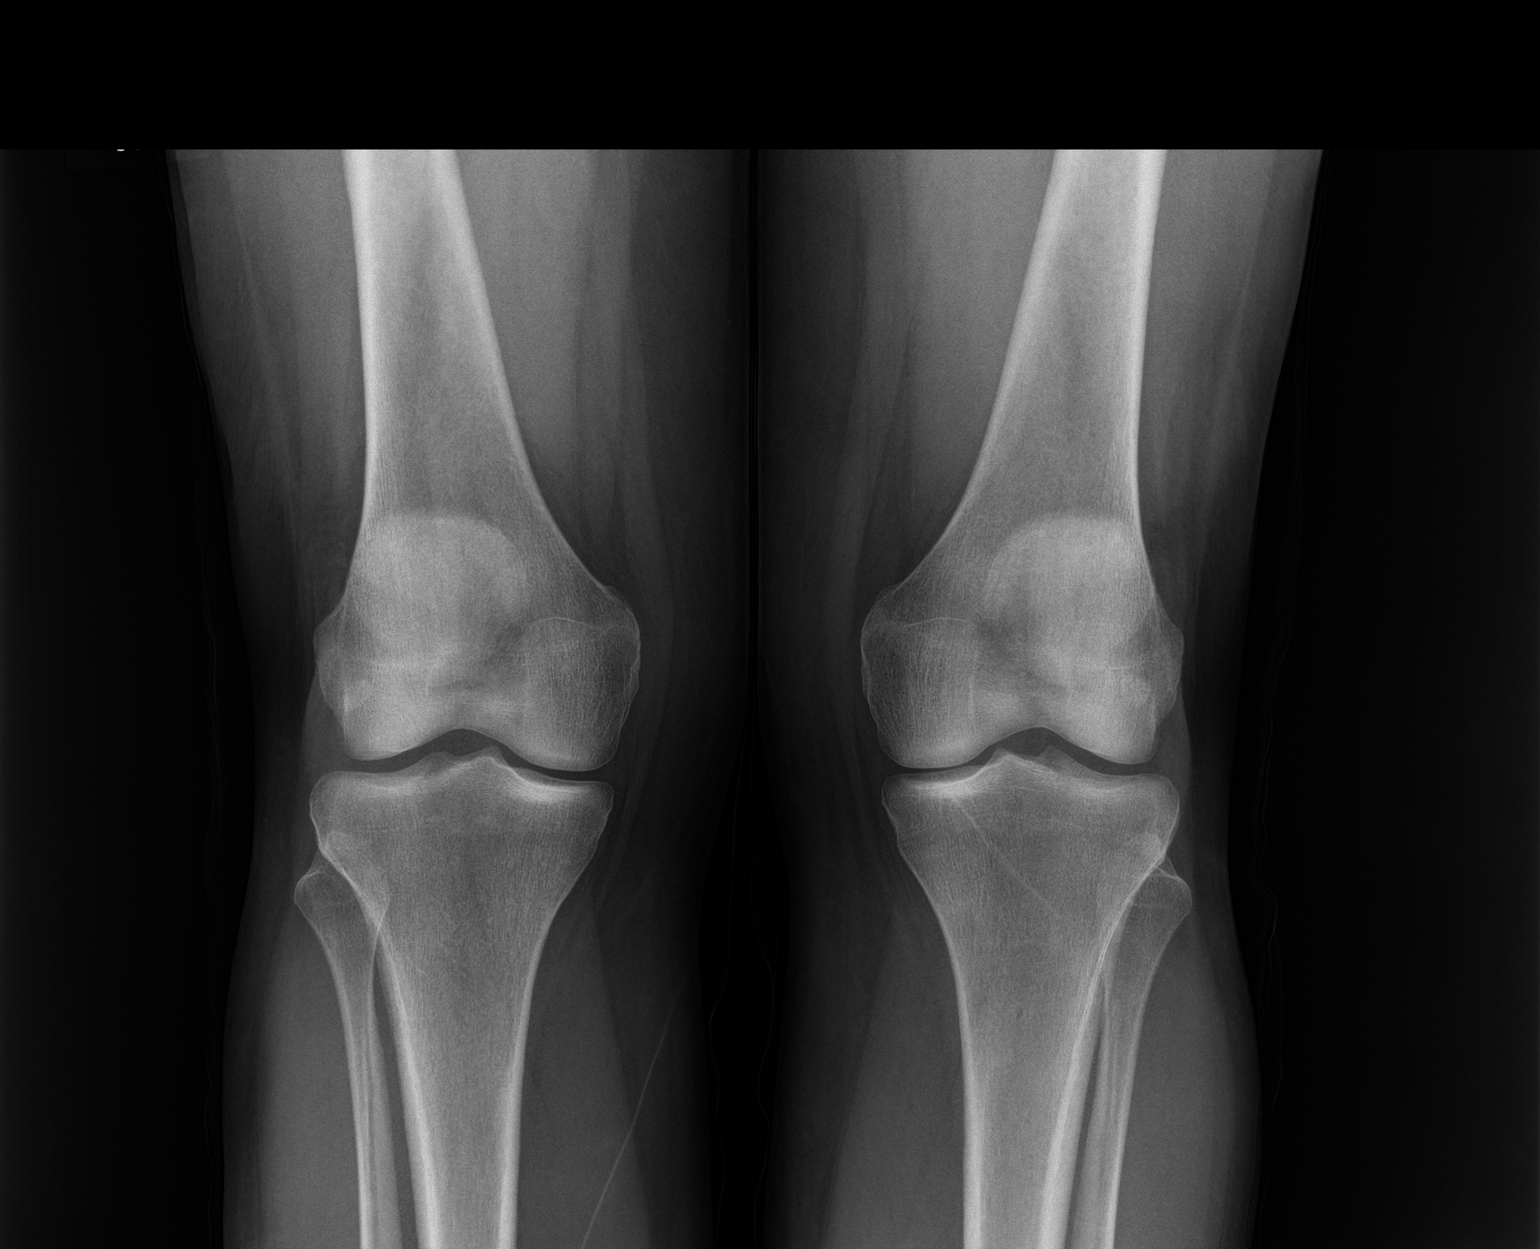

[knee lat (1 of 2)]
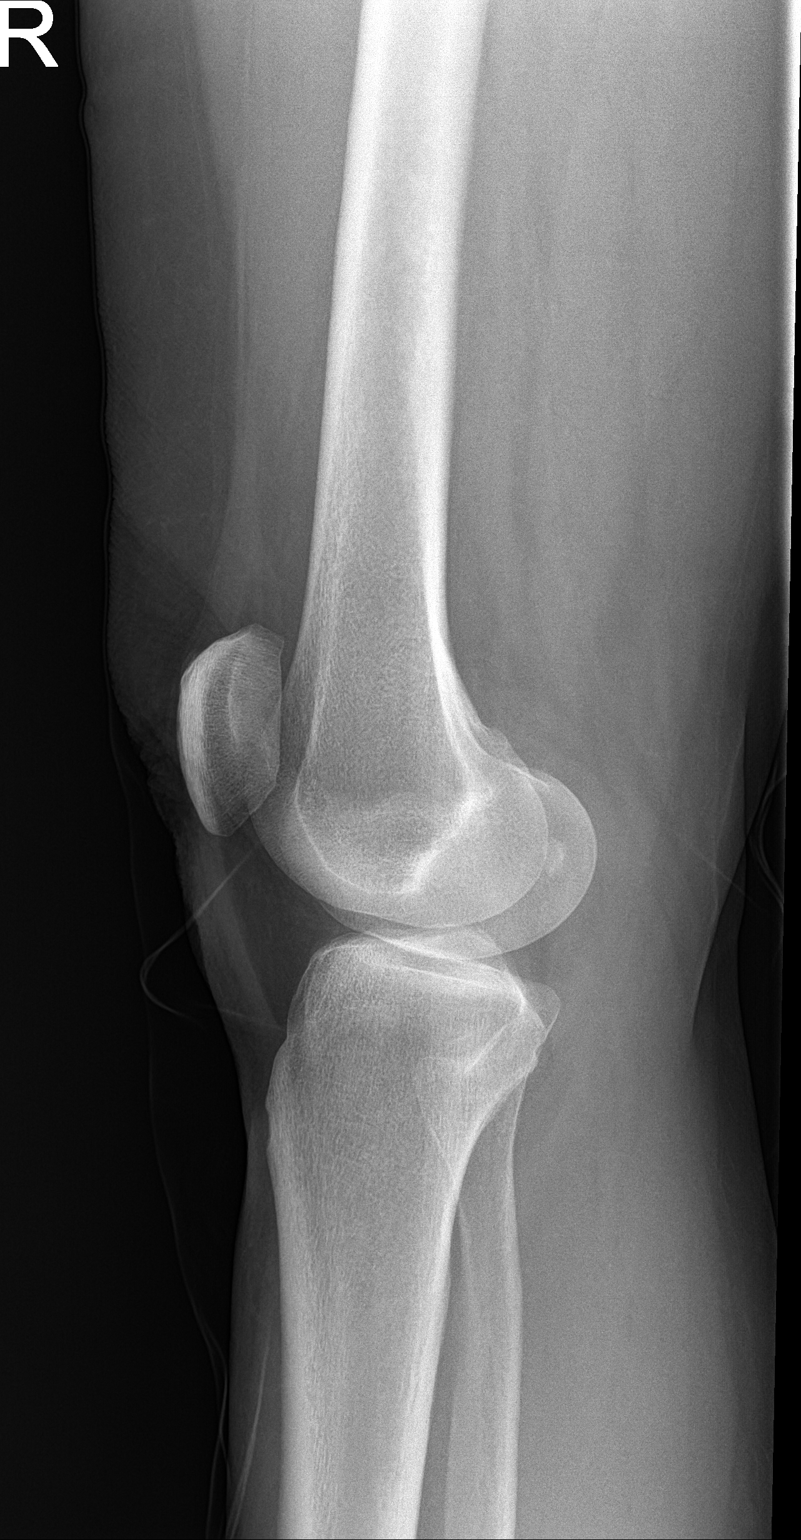

[knee lat (2 of 2)]
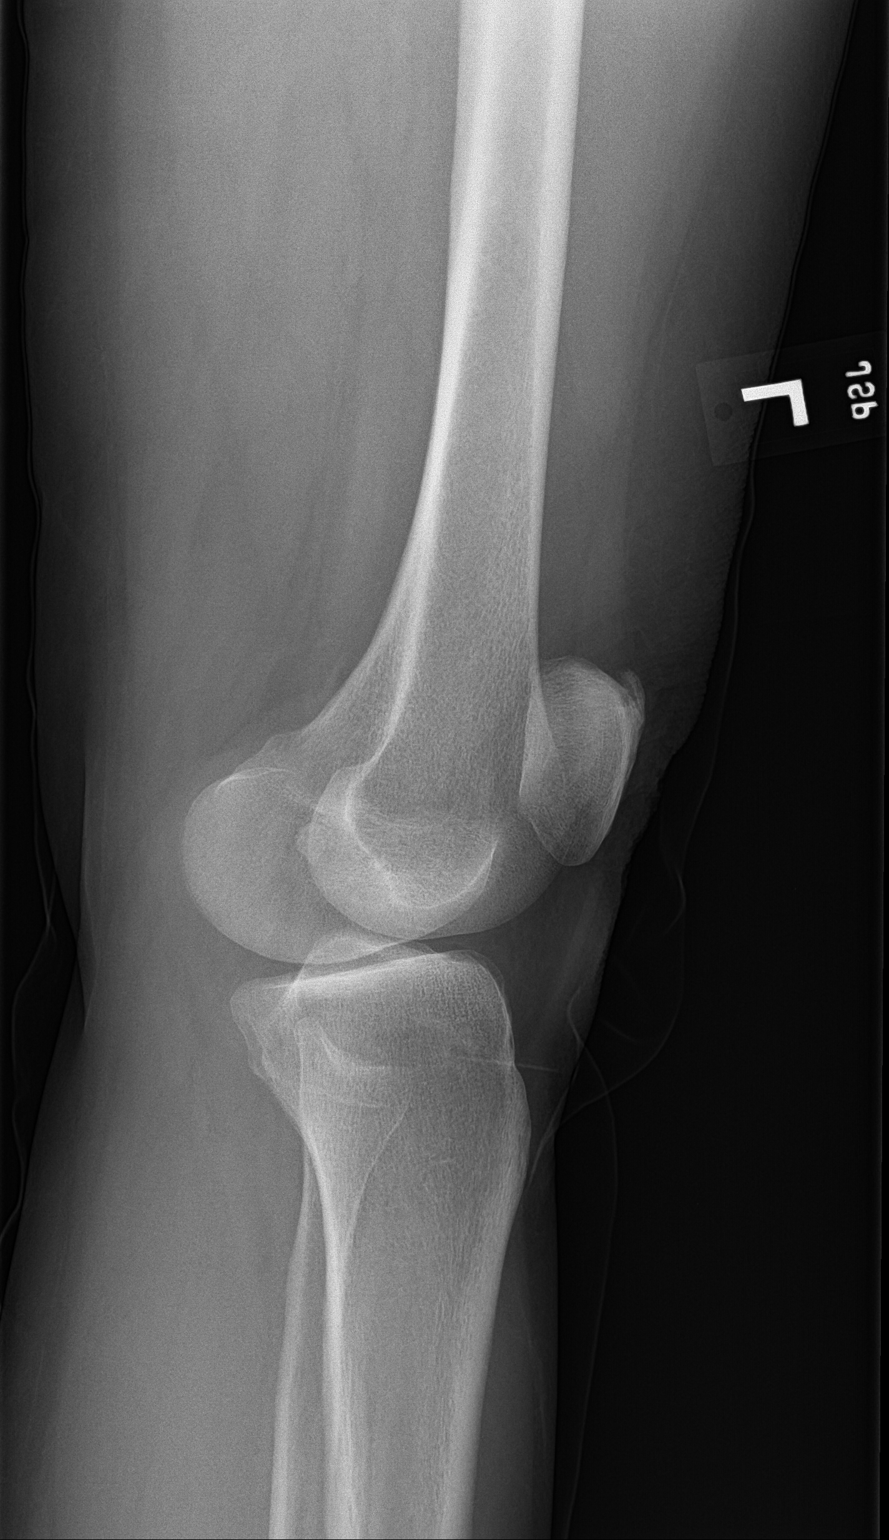

[3 of 3 positions shown; findings below may reference images not displayed]

FINDINGS: No evidence of fracture, dislocation, or joint effusion. No evidence
of arthropathy or other focal bone abnormality. Soft tissues are
unremarkable.
IMPRESSION: Negative.

## 2019-09-23 ENCOUNTER — Other Ambulatory Visit: Payer: Self-pay | Admitting: Physician Assistant

## 2019-09-23 DIAGNOSIS — F411 Generalized anxiety disorder: Secondary | ICD-10-CM

## 2019-10-02 ENCOUNTER — Encounter: Payer: Self-pay | Admitting: Cardiology

## 2019-10-02 ENCOUNTER — Other Ambulatory Visit: Payer: Self-pay

## 2019-10-02 ENCOUNTER — Ambulatory Visit (INDEPENDENT_AMBULATORY_CARE_PROVIDER_SITE_OTHER): Payer: Self-pay | Admitting: Cardiology

## 2019-10-02 VITALS — BP 135/92 | HR 108 | Temp 98.5°F | Ht 65.0 in | Wt 164.0 lb

## 2019-10-02 DIAGNOSIS — E782 Mixed hyperlipidemia: Secondary | ICD-10-CM

## 2019-10-02 DIAGNOSIS — R0789 Other chest pain: Secondary | ICD-10-CM

## 2019-10-02 DIAGNOSIS — R079 Chest pain, unspecified: Secondary | ICD-10-CM

## 2019-10-02 DIAGNOSIS — I10 Essential (primary) hypertension: Secondary | ICD-10-CM

## 2019-10-02 DIAGNOSIS — Z8249 Family history of ischemic heart disease and other diseases of the circulatory system: Secondary | ICD-10-CM | POA: Diagnosis not present

## 2019-10-02 DIAGNOSIS — F17200 Nicotine dependence, unspecified, uncomplicated: Secondary | ICD-10-CM

## 2019-10-02 MED ORDER — ASPIRIN EC 81 MG PO TBEC: 81.0000 mg | DELAYED_RELEASE_TABLET | Freq: Every day | ORAL | 1 refills | Status: AC

## 2019-10-02 MED ORDER — LOSARTAN POTASSIUM 25 MG PO TABS: 50.0000 mg | ORAL_TABLET | Freq: Every day | ORAL | 2 refills | Status: DC

## 2019-10-02 MED ORDER — METOPROLOL SUCCINATE ER 25 MG PO TB24: 25.0000 mg | ORAL_TABLET | Freq: Every day | ORAL | 2 refills | Status: DC

## 2019-10-02 NOTE — Progress Notes (Signed)
Primary Physician/Referring:  Terald Sleeper, PA-C  Patient ID: Sarah Mcgee, female    DOB: April 05, 1966, 53 y.o.   MRN: [id number]  Chief Complaint  Patient presents with  . New Patient (Initial Visit)  . Chest Pain  . Hyperlipidemia   HPI:    Sarah Mcgee  is a 53 y.o. Caucasian female with history of hypertension, mixed hyperlipidemia, tobacco use disorder, started smoking at age 18 and has a 9 to 20 pack history of smoking, self-referred herself to be evaluated for chest pain.  She had complete physical in February 2020 and was started on metoprolol succinate 12.5 mg daily and Lipitor 20 mg daily.  She has not had a follow-up.  Over the past 6 weeks she started noticing chest pain in the middle of the chest sometimes radiating to her left side body and also left arm.  Discomfort occurs both at rest and with exertion activity and at workplace she has to climb flights of stairs and has to stop and rest as she develops chest pain.  Occasionally she has had pain at rest and also states that her chest feels "tender".  No other associated symptoms of dyspnea or palpitations.  She does feel fatigued and tired.  Admits to smoking about at least 1 pack of cigarettes a day.  No recent weight changes, no leg edema, denies symptoms of claudication.  Past Medical History:  Diagnosis Date  . Anxiety   . Depression   . Hyperlipidemia    Past Surgical History:  Procedure Laterality Date  . ABDOMINAL HYSTERECTOMY    . arm surgery Right    shattered elbow  . CESAREAN SECTION     Social History   Socioeconomic History  . Marital status: Divorced    Spouse name: Not on file  . Number of children: 2  . Years of education: Not on file  . Highest education level: Not on file  Occupational History  . Not on file  Social Needs  . Financial resource strain: Not on file  . Food insecurity    Worry: Not on file    Inability: Not on file  . Transportation needs    Medical: Not on file   Non-medical: Not on file  Tobacco Use  . Smoking status: Current Every Day Smoker    Types: Cigarettes  . Smokeless tobacco: Never Used  Substance and Sexual Activity  . Alcohol use: Yes    Comment: occ  . Drug use: No  . Sexual activity: Not on file  Lifestyle  . Physical activity    Days per week: Not on file    Minutes per session: Not on file  . Stress: Not on file  Relationships  . Social Herbalist on phone: Not on file    Gets together: Not on file    Attends religious service: Not on file    Active member of club or organization: Not on file    Attends meetings of clubs or organizations: Not on file    Relationship status: Not on file  . Intimate partner violence    Fear of current or ex partner: Not on file    Emotionally abused: Not on file    Physically abused: Not on file    Forced sexual activity: Not on file  Other Topics Concern  . Not on file  Social History Narrative  . Not on file   ROS  Review of Systems  Constitution: Negative for chills, decreased  appetite, malaise/fatigue and weight gain.  Cardiovascular: Positive for chest pain. Negative for claudication, dyspnea on exertion, leg swelling and syncope.  Endocrine: Negative for cold intolerance.  Hematologic/Lymphatic: Does not bruise/bleed easily.  Musculoskeletal: Negative for joint swelling.  Gastrointestinal: Negative for abdominal pain, anorexia, change in bowel habit, hematochezia and melena.  Neurological: Negative for headaches and light-headedness.  Psychiatric/Behavioral: Positive for depression. Negative for substance abuse. The patient has insomnia and is nervous/anxious.   All other systems reviewed and are negative.  Objective   Vitals with BMI 10/02/2019 02/07/2019 01/21/2019  Height '5\' 5"'  '5\' 5"'  -  Weight 164 lbs 171 lbs 3 oz -  BMI 38.32 91.91 -  Systolic 660 600 459  Diastolic 92 83 88  Pulse 977 94 87    Blood pressure (!) 135/92, pulse (!) 108, temperature 98.5 F  (36.9 C), height '5\' 5"'  (1.651 m), weight 164 lb (74.4 kg), SpO2 94 %. Body mass index is 27.29 kg/m.   Physical Exam  Constitutional: She appears well-developed and well-nourished. No distress.  HENT:  Head: Atraumatic.  Eyes: Conjunctivae are normal.  Neck: Neck supple. No JVD present. No thyromegaly present.  Cardiovascular: Regular rhythm, normal heart sounds, intact distal pulses and normal pulses. Tachycardia present. Exam reveals no gallop.  No murmur heard. There is no JVD, no leg edema.  Pulmonary/Chest: Effort normal and breath sounds normal.  Abdominal: Soft. Bowel sounds are normal.  Musculoskeletal: Normal range of motion.  Neurological: She is alert.  Skin: Skin is warm and dry.  Psychiatric: She has a normal mood and affect.   Radiology: No results found.  Laboratory examination:   Recent Labs    02/08/19 0836  NA 141  K 4.9  CL 100  CO2 23  GLUCOSE 85  BUN 10  CREATININE 0.66  CALCIUM 9.7  GFRNONAA 101  GFRAA 117   CMP Latest Ref Rng & Units 02/08/2019 11/29/2017 09/21/2016  Glucose 65 - 99 mg/dL 85 93 101(H)  BUN 6 - 24 mg/dL '10 15 11  ' Creatinine 0.57 - 1.00 mg/dL 0.66 0.82 0.70  Sodium 134 - 144 mmol/L 141 142 140  Potassium 3.5 - 5.2 mmol/L 4.9 4.3 4.4  Chloride 96 - 106 mmol/L 100 101 99  CO2 20 - 29 mmol/L '23 27 26  ' Calcium 8.7 - 10.2 mg/dL 9.7 9.8 10.1  Total Protein 6.0 - 8.5 g/dL 7.4 7.1 7.2  Total Bilirubin 0.0 - 1.2 mg/dL 0.2 0.5 0.7  Alkaline Phos 39 - 117 IU/L 88 75 80  AST 0 - 40 IU/L 23 30 33  ALT 0 - 32 IU/L 25 36(H) 41(H)   CBC Latest Ref Rng & Units 02/08/2019 06/26/2018 11/29/2017  WBC 3.4 - 10.8 x10E3/uL 10.8 6.9 8.8  Hemoglobin 11.1 - 15.9 g/dL 15.8 13.3 14.2  Hematocrit 34.0 - 46.6 % 45.7 39.6 42.3  Platelets 150 - 450 x10E3/uL 420 320 312   Lipid Panel     Component Value Date/Time   CHOL 239 (H) 02/08/2019 0836   TRIG 236 (H) 02/08/2019 0836   HDL 46 02/08/2019 0836   CHOLHDL 5.2 (H) 02/08/2019 0836   LDLCALC 146 (H)  02/08/2019 0836   HEMOGLOBIN A1C No results found for: HGBA1C, MPG TSH Recent Labs    02/08/19 0836  TSH 1.390   Medications and allergies   Allergies  Allergen Reactions  . Codeine Itching and Nausea And Vomiting     Prior to Admission medications   Medication Sig Start Date End Date Taking?  Authorizing Provider  albuterol (PROAIR HFA) 108 (90 Base) MCG/ACT inhaler TAKE 2 PUFFS BY MOUTH EVERY 6 HOURS AS NEEDED FOR WHEEZE OR SHORTNESS OF BREATH 06/09/19  Yes Terald Sleeper, PA-C  ALPRAZolam Duanne Moron) 0.5 MG tablet Take 1 tablet (0.5 mg total) by mouth 2 (two) times daily as needed for anxiety. 02/07/19  Yes Terald Sleeper, PA-C  atorvastatin (LIPITOR) 20 MG tablet Take 1 tablet (20 mg total) by mouth daily. (Needs to be seen before next refill) 02/07/19  Yes Terald Sleeper, PA-C  citalopram (CELEXA) 40 MG tablet Take 1 tablet (40 mg total) by mouth daily. 02/07/19  Yes Terald Sleeper, PA-C  desonide (DESOWEN) 0.05 % cream Apply topically 2 (two) times daily. 02/07/19  Yes Terald Sleeper, PA-C  gabapentin (NEURONTIN) 300 MG capsule Take 1 capsule (300 mg total) by mouth 3 (three) times daily. (Needs to be seen before next refill) 02/07/19  Yes Terald Sleeper, PA-C  metoprolol succinate (TOPROL XL) 25 MG 24 hr tablet Take 0.5 tablets (12.5 mg total) by mouth daily. 02/07/19  Yes Terald Sleeper, PA-C  ondansetron (ZOFRAN) 4 MG tablet Take 1 tablet (4 mg total) by mouth every 8 (eight) hours as needed for nausea or vomiting. 01/21/19  Yes Evelina Dun A, FNP  traZODone (DESYREL) 100 MG tablet TAKE 1 TABLET BY MOUTH EVERYDAY AT BEDTIME 02/07/19  Yes Terald Sleeper, PA-C     Current Outpatient Medications  Medication Instructions  . albuterol (PROAIR HFA) 108 (90 Base) MCG/ACT inhaler TAKE 2 PUFFS BY MOUTH EVERY 6 HOURS AS NEEDED FOR WHEEZE OR SHORTNESS OF BREATH  . ALPRAZolam (XANAX) 0.5 mg, Oral, 2 times daily PRN  . aspirin EC 81 mg, Oral, Daily  . atorvastatin (LIPITOR) 20 mg, Oral, Daily,  (Needs to be seen before next refill)  . citalopram (CELEXA) 40 mg, Oral, Daily  . desonide (DESOWEN) 0.05 % cream Topical, 2 times daily  . gabapentin (NEURONTIN) 300 mg, Oral, 3 times daily, (Needs to be seen before next refill)  . losartan (COZAAR) 50 mg, Oral, Daily after supper  . metoprolol succinate (TOPROL XL) 25 mg, Oral, Daily  . ondansetron (ZOFRAN) 4 mg, Oral, Every 8 hours PRN  . traZODone (DESYREL) 100 MG tablet TAKE 1 TABLET BY MOUTH EVERYDAY AT BEDTIME    Cardiac Studies:   None  Assessment     ICD-10-CM   1. Exertional chest pain  R07.9 PCV MYOCARDIAL PERFUSION WO LEXISCAN    PCV ECHOCARDIOGRAM COMPLETE    aspirin EC 81 MG tablet  2. Mixed hyperlipidemia  E78.2 EKG 12-Lead    Lipid Panel With LDL/HDL Ratio    LDL cholesterol, direct    PCV MYOCARDIAL PERFUSION WO LEXISCAN  3. Essential hypertension  I10 PCV MYOCARDIAL PERFUSION WO LEXISCAN    metoprolol succinate (TOPROL XL) 25 MG 24 hr tablet    losartan (COZAAR) 25 MG tablet    CMP14+EGFR  4. Tobacco use disorder  F17.200 PCV MYOCARDIAL PERFUSION WO LEXISCAN  5. Family history of premature CAD  Z24.49    Father had MI at age 54Y    EKG 10/02/2019: Normal sinus rhythm at rate of 96 bpm, left atrial enlargement, normal axis.  No evidence of ischemia, normal QT interval.  Recommendations:   Patient self-referred herself to see me, her father "Tobin Chad" is my patient as well.  I am very concerned about her chest pain symptoms which appear to suggest angina pectoris. Patient instructed to start ASA  10m q daily for prophylaxis.  Increase metoprolol succinate to 25 mg daily, blood pressure is still elevated and she is tachycardic at rest.  TSH is normal.  She needs lipid profile testing to reevaluate her lipid status.  We discussed regarding making dietary changes. She appears to be motivated for smoking cessation.  However patient is on maximum dose of Celexa and also takes trazodone for sleep, hence I did not  want to add Wellbutrin.  We discussed regarding making lifestyle changes with regard to sleep including use of melatonin and avoiding watching TV.  We can revisit this at some point regarding his restarting Wellbutrin but in the interim advised her to use nicotine patch.  This visit included additional 6 minutes of counseling face-to-face interaction.  I also advised her to call [phone] now.  In view of her multiple cardiovascular risk factors that includes family history of coronary disease in her father, I recommended further cardiac testing. Schedule for a Exercise Nuclear stress test to evaluate for myocardial ischemia. Will schedule for an echocardiogram. Office visit following the work-up/investigations.   JAdrian Prows MD, FO[facility]10/07/2019, 6:34 PM PR[facility] Creek[facility]ovascular. PKnob NosterPager: [phone] Office: [phone] no answer Cell [phone]

## 2019-10-02 NOTE — Patient Instructions (Signed)
Please get labs in 10 days to 2 weeks at [facility].   Call [phone].  You can use Melatonin 10 mg at night.

## 2019-10-12 ENCOUNTER — Other Ambulatory Visit: Payer: Self-pay | Admitting: Physician Assistant

## 2019-10-17 ENCOUNTER — Ambulatory Visit (INDEPENDENT_AMBULATORY_CARE_PROVIDER_SITE_OTHER): Payer: Self-pay

## 2019-10-17 ENCOUNTER — Other Ambulatory Visit: Payer: Self-pay

## 2019-10-17 DIAGNOSIS — R079 Chest pain, unspecified: Secondary | ICD-10-CM

## 2019-10-18 LAB — CMP14+EGFR
ALT: 26 IU/L (ref 0–32)
AST: 23 IU/L (ref 0–40)
Albumin/Globulin Ratio: 1.9 (ref 1.2–2.2)
Albumin: 4.8 g/dL (ref 3.8–4.9)
Alkaline Phosphatase: 98 IU/L (ref 39–117)
BUN/Creatinine Ratio: 18 (ref 9–23)
BUN: 13 mg/dL (ref 6–24)
Bilirubin Total: 0.6 mg/dL (ref 0.0–1.2)
CO2: 23 mmol/L (ref 20–29)
Calcium: 11.2 mg/dL — ABNORMAL HIGH (ref 8.7–10.2)
Chloride: 100 mmol/L (ref 96–106)
Creatinine, Ser: 0.72 mg/dL (ref 0.57–1.00)
GFR calc Af Amer: 111 mL/min/{1.73_m2} (ref >59)
GFR calc non Af Amer: 96 mL/min/{1.73_m2} (ref >59)
Globulin, Total: 2.5 g/dL (ref 1.5–4.5)
Glucose: 102 mg/dL — ABNORMAL HIGH (ref 65–99)
Potassium: 4.8 mmol/L (ref 3.5–5.2)
Sodium: 139 mmol/L (ref 134–144)
Total Protein: 7.3 g/dL (ref 6.0–8.5)

## 2019-10-18 LAB — LIPID PANEL WITH LDL/HDL RATIO
Cholesterol, Total: 275 mg/dL — ABNORMAL HIGH (ref 100–199)
HDL: 56 mg/dL (ref >39)
LDL Chol Calc (NIH): 190 mg/dL — ABNORMAL HIGH (ref 0–99)
LDL/HDL Ratio: 3.4 ratio — ABNORMAL HIGH (ref 0.0–3.2)
Triglycerides: 158 mg/dL — ABNORMAL HIGH (ref 0–149)
VLDL Cholesterol Cal: 29 mg/dL (ref 5–40)

## 2019-10-18 LAB — LDL CHOLESTEROL, DIRECT: LDL Direct: 195 mg/dL — ABNORMAL HIGH (ref 0–99)

## 2019-10-27 ENCOUNTER — Other Ambulatory Visit: Payer: Self-pay | Admitting: Cardiology

## 2019-10-27 DIAGNOSIS — I10 Essential (primary) hypertension: Secondary | ICD-10-CM

## 2019-11-10 ENCOUNTER — Other Ambulatory Visit: Payer: Managed Care, Other (non HMO)

## 2019-11-18 ENCOUNTER — Ambulatory Visit: Payer: Managed Care, Other (non HMO) | Admitting: Cardiology

## 2019-12-02 ENCOUNTER — Telehealth: Payer: Self-pay

## 2019-12-09 ENCOUNTER — Other Ambulatory Visit: Payer: Self-pay | Admitting: Physician Assistant

## 2019-12-31 ENCOUNTER — Telehealth: Payer: Self-pay | Admitting: Cardiology

## 2020-01-12 ENCOUNTER — Ambulatory Visit (INDEPENDENT_AMBULATORY_CARE_PROVIDER_SITE_OTHER): Payer: 59

## 2020-01-12 ENCOUNTER — Other Ambulatory Visit: Payer: Self-pay

## 2020-01-12 DIAGNOSIS — F17200 Nicotine dependence, unspecified, uncomplicated: Secondary | ICD-10-CM

## 2020-01-12 DIAGNOSIS — R079 Chest pain, unspecified: Secondary | ICD-10-CM

## 2020-01-12 DIAGNOSIS — E782 Mixed hyperlipidemia: Secondary | ICD-10-CM | POA: Diagnosis not present

## 2020-01-12 DIAGNOSIS — I10 Essential (primary) hypertension: Secondary | ICD-10-CM | POA: Diagnosis not present

## 2020-01-16 ENCOUNTER — Telehealth: Payer: Self-pay

## 2020-01-16 NOTE — Telephone Encounter (Signed)
Pt calling regarding lexi results. I don't see resulted message. Please advise.//ah

## 2020-02-24 ENCOUNTER — Encounter: Payer: Self-pay | Admitting: Family Medicine

## 2020-02-24 ENCOUNTER — Telehealth (INDEPENDENT_AMBULATORY_CARE_PROVIDER_SITE_OTHER): Payer: 59 | Admitting: Family Medicine

## 2020-02-24 DIAGNOSIS — M545 Low back pain, unspecified: Secondary | ICD-10-CM

## 2020-02-24 MED ORDER — CYCLOBENZAPRINE HCL 10 MG PO TABS: 10.0000 mg | ORAL_TABLET | Freq: Three times a day (TID) | ORAL | 1 refills | Status: DC | PRN

## 2020-02-24 MED ORDER — PREDNISONE 10 MG PO TABS: ORAL_TABLET | ORAL | 0 refills | Status: DC

## 2020-02-24 NOTE — Patient Instructions (Signed)

## 2020-02-24 NOTE — Progress Notes (Signed)
Subjective:    Patient ID: Sarah Mcgee, female    DOB: [birth year], 54 y.o.   MRN: [id number]   HPI: Sarah Mcgee and I connected by video this morning. She is a 54 y.o. female presenting for onset yesterday of severe back pain. She rates it 8-9/10 for severity. She hurts to waalk. Hurts to do anything except to lay in a recliner with two pillows under the knees.The day before had been doing a lot of housework. NKI. Similar sx in past, but not often. Has been this bad in the past, but not for a long time. The pain radiates to the knees bilaterally.    Depression screen [facility] 2/9 01/21/2019 07/09/2018 06/26/2018 05/03/2018 11/29/2017  Decreased Interest 0 0 2 0 0  Down, Depressed, Hopeless 0 0 1 1 0  PHQ - 2 Score 0 0 3 1 0  Altered sleeping - - 2 - -  Tired, decreased energy - - 3 - -  Change in appetite - - 0 - -  Feeling bad or failure about yourself  - - 0 - -  Trouble concentrating - - 0 - -  Moving slowly or fidgety/restless - - 3 - -  Suicidal thoughts - - 0 - -  PHQ-9 Score - - 11 - -     Relevant past medical, surgical, family and social history reviewed and updated as indicated.  Interim medical history since our last visit reviewed. Allergies and medications reviewed and updated.  ROS:  Review of Systems  Constitutional: Negative.   HENT: Negative.   Eyes: Negative for visual disturbance.  Respiratory: Negative for shortness of breath.   Cardiovascular: Negative for chest pain.  Gastrointestinal: Negative for abdominal pain.  Musculoskeletal: Positive for arthralgias, back pain, gait problem and myalgias.     Social History   Tobacco Use  Smoking Status Current Every Day Smoker  . Types: Cigarettes  Smokeless Tobacco Never Used       Objective:     Wt Readings from Last 3 Encounters:  10/02/19 164 lb (74.4 kg)  02/07/19 171 lb 3.2 oz (77.7 kg)  01/21/19 169 lb 12.8 oz (77 kg)     Due to national restrictions based on Covid pandemic, the patient was  evaluated today through video connection.  Physical Exam Constitutional:      General: She is in acute distress (Secondary to pain).  HENT:     Head: Normocephalic.     Right Ear: External ear normal.     Left Ear: External ear normal.     Nose: Nose normal.  Musculoskeletal:     Comments: Range of motion for spinal extension and flexion decreased as demonstrated by patient over the video connection  Skin:    Coloration: Skin is not jaundiced or pale.  Neurological:     Mental Status: She is alert and oriented to person, place, and time.     Motor: No weakness.  Psychiatric:        Mood and Affect: Mood normal.        Thought Content: Thought content normal.      Assessment & Plan:   1. Acute midline low back pain without sciatica     Meds ordered this encounter  Medications  . cyclobenzaprine (FLEXERIL) 10 MG tablet    Sig: Take 1 tablet (10 mg total) by mouth 3 (three) times daily as needed for muscle spasms.    Dispense:  90 tablet    Refill:  1  .  predniSONE (DELTASONE) 10 MG tablet    Sig: Take 5 daily for 3 days followed by 4,3,2 and 1 for 3 days each.    Dispense:  45 tablet    Refill:  0    No orders of the defined types were placed in this encounter.     Diagnoses and all orders for this visit:  Acute midline low back pain without sciatica  Other orders -     cyclobenzaprine (FLEXERIL) 10 MG tablet; Take 1 tablet (10 mg total) by mouth 3 (three) times daily as needed for muscle spasms. -     predniSONE (DELTASONE) 10 MG tablet; Take 5 daily for 3 days followed by 4,3,2 and 1 for 3 days each.    Virtual Visit via telephone Note  I discussed the limitations, risks, security and privacy concerns of performing an evaluation and management service by telephone and the availability of in person appointments. The patient was identified with two identifiers. Pt.expressed understanding and agreed to proceed. Pt. Is at home. Dr. Darlyn Read is in his  office.  Follow Up Instructions:   I discussed the assessment and treatment plan with the patient. The patient was provided an opportunity to ask questions and all were answered. The patient agreed with the plan and demonstrated an understanding of the instructions.   The patient was advised to call back or seek an in-person evaluation if the symptoms worsen or if the condition fails to improve as anticipated.   Total minutes including chart review and phone contact time: 18   Follow up plan: Return if symptoms worsen or fail to improve.  Mechele Claude, MD [place] [facility]

## 2020-05-04 ENCOUNTER — Other Ambulatory Visit: Payer: Self-pay

## 2020-05-04 ENCOUNTER — Ambulatory Visit (INDEPENDENT_AMBULATORY_CARE_PROVIDER_SITE_OTHER): Payer: 59 | Admitting: Nurse Practitioner

## 2020-05-04 ENCOUNTER — Encounter: Payer: Self-pay | Admitting: Nurse Practitioner

## 2020-05-04 VITALS — BP 119/82 | HR 103 | Temp 98.4°F | Ht 65.0 in | Wt 173.8 lb

## 2020-05-04 DIAGNOSIS — M5442 Lumbago with sciatica, left side: Secondary | ICD-10-CM | POA: Diagnosis not present

## 2020-05-04 DIAGNOSIS — M5441 Lumbago with sciatica, right side: Secondary | ICD-10-CM | POA: Diagnosis not present

## 2020-05-04 DIAGNOSIS — I10 Essential (primary) hypertension: Secondary | ICD-10-CM

## 2020-05-04 DIAGNOSIS — E785 Hyperlipidemia, unspecified: Secondary | ICD-10-CM

## 2020-05-04 MED ORDER — GABAPENTIN 300 MG PO CAPS: 300.0000 mg | ORAL_CAPSULE | Freq: Three times a day (TID) | ORAL | 0 refills | Status: DC

## 2020-05-04 MED ORDER — TRAZODONE HCL 100 MG PO TABS: ORAL_TABLET | ORAL | 0 refills | Status: DC

## 2020-05-04 MED ORDER — CITALOPRAM HYDROBROMIDE 40 MG PO TABS: 40.0000 mg | ORAL_TABLET | Freq: Every day | ORAL | 0 refills | Status: DC

## 2020-05-04 MED ORDER — CYCLOBENZAPRINE HCL 10 MG PO TABS: 10.0000 mg | ORAL_TABLET | Freq: Three times a day (TID) | ORAL | 0 refills | Status: DC | PRN

## 2020-05-04 MED ORDER — METOPROLOL SUCCINATE ER 25 MG PO TB24: 25.0000 mg | ORAL_TABLET | Freq: Every day | ORAL | 0 refills | Status: DC

## 2020-05-04 MED ORDER — ATORVASTATIN CALCIUM 20 MG PO TABS: 20.0000 mg | ORAL_TABLET | Freq: Every day | ORAL | 0 refills | Status: DC

## 2020-05-04 MED ORDER — PREDNISONE 10 MG (21) PO TBPK: ORAL_TABLET | ORAL | 0 refills | Status: DC

## 2020-05-04 MED ORDER — LOSARTAN POTASSIUM 25 MG PO TABS: 50.0000 mg | ORAL_TABLET | Freq: Every day | ORAL | 0 refills | Status: DC

## 2020-05-04 NOTE — Progress Notes (Signed)
Established Patient Office Visit  Subjective:  Patient ID: Sarah Mcgee, female    DOB: Feb 21, 1966  Age: 54 y.o. MRN: [id number]  CC:  Chief Complaint  Patient presents with  . Back Pain    lower    HPI Sarah Mcgee presents for  Back Pain This is a new problem. The current episode started 1 to 4 weeks ago. The problem occurs constantly. The problem has been gradually worsening since onset. The pain is present in the lumbar spine. The pain is at a severity of 8/10. The pain is severe. The pain is worse during the day. The symptoms are aggravated by position. Pertinent negatives include no chest pain or headaches. She has tried heat for the symptoms. The treatment provided no relief.    Past Medical History:  Diagnosis Date  . Anxiety   . Depression   . Hyperlipidemia     Past Surgical History:  Procedure Laterality Date  . ABDOMINAL HYSTERECTOMY    . arm surgery Right    shattered elbow  . CESAREAN SECTION      Family History  Problem Relation Age of Onset  . Hypertension Mother   . Hyperlipidemia Mother   . Stroke Father   . Stroke Maternal Grandmother   . Heart attack Maternal Grandfather   . Cancer Paternal Grandmother   . Cancer Paternal Grandfather     Social History   Socioeconomic History  . Marital status: Divorced    Spouse name: Not on file  . Number of children: 2  . Years of education: Not on file  . Highest education level: Not on file  Occupational History  . Not on file  Tobacco Use  . Smoking status: Current Every Day Smoker    Types: Cigarettes  . Smokeless tobacco: Never Used  Substance and Sexual Activity  . Alcohol use: Yes    Comment: occ  . Drug use: No  . Sexual activity: Not on file  Other Topics Concern  . Not on file  Social History Narrative  . Not on file   Social Determinants of Health   Financial Resource Strain:   . Difficulty of Paying Living Expenses:   Food Insecurity:   . Worried About Charity fundraiser in  the Last Year:   . Arboriculturist in the Last Year:   Transportation Needs:   . Film/video editor (Medical):   Marland Kitchen Lack of Transportation (Non-Medical):   Physical Activity:   . Days of Exercise per Week:   . Minutes of Exercise per Session:   Stress:   . Feeling of Stress :   Social Connections:   . Frequency of Communication with Friends and Family:   . Frequency of Social Gatherings with Friends and Family:   . Attends Religious Services:   . Active Member of Clubs or Organizations:   . Attends Archivist Meetings:   Marland Kitchen Marital Status:   Intimate Partner Violence:   . Fear of Current or Ex-Partner:   . Emotionally Abused:   Marland Kitchen Physically Abused:   . Sexually Abused:       Allergies  Allergen Reactions  . Codeine Itching and Nausea And Vomiting    ROS Review of Systems  Respiratory: Negative for chest tightness and shortness of breath.   Cardiovascular: Negative for chest pain, palpitations and leg swelling.  Genitourinary: Negative for difficulty urinating.  Musculoskeletal: Positive for back pain, joint swelling and myalgias.  Skin: Negative for rash.  Neurological:  Negative for headaches.  Psychiatric/Behavioral: The patient is not nervous/anxious.       Objective:    Physical Exam  Constitutional: She is oriented to person, place, and time. She appears well-developed and well-nourished.  Eyes: Conjunctivae are normal.  Cardiovascular: Normal rate.  Pulmonary/Chest: Breath sounds normal.  Abdominal: Bowel sounds are normal.  Musculoskeletal:        General: Tenderness present.     Cervical back: Neck supple.     Comments: Lower back pain  Neurological: She is oriented to person, place, and time.  Skin: Skin is dry. No rash noted. No erythema.  Psychiatric: She has a normal mood and affect. Her behavior is normal.    BP 119/82   Pulse (!) 103   Temp 98.4 F (36.9 C)   Ht 5\' 5"  (1.651 m)   Wt 173 lb 12.8 oz (78.8 kg)   SpO2 96%   BMI  28.92 kg/m  Wt Readings from Last 3 Encounters:  05/04/20 173 lb 12.8 oz (78.8 kg)  10/02/19 164 lb (74.4 kg)  02/07/19 171 lb 3.2 oz (77.7 kg)     Health Maintenance Due  Topic Date Due  . HIV Screening  Never done  . COLONOSCOPY  Never done    There are no preventive care reminders to display for this patient.  Lab Results  Component Value Date   TSH 1.390 02/08/2019   Lab Results  Component Value Date   WBC 10.8 02/08/2019   HGB 15.8 02/08/2019   HCT 45.7 02/08/2019   MCV 91 02/08/2019   PLT 420 02/08/2019   Lab Results  Component Value Date   NA 139 10/17/2019   K 4.8 10/17/2019   CO2 23 10/17/2019   GLUCOSE 102 (H) 10/17/2019   BUN 13 10/17/2019   CREATININE 0.72 10/17/2019   BILITOT 0.6 10/17/2019   ALKPHOS 98 10/17/2019   AST 23 10/17/2019   ALT 26 10/17/2019   PROT 7.3 10/17/2019   ALBUMIN 4.8 10/17/2019   CALCIUM 11.2 (H) 10/17/2019   Lab Results  Component Value Date   CHOL 275 (H) 10/17/2019   Lab Results  Component Value Date   HDL 56 10/17/2019   Lab Results  Component Value Date   LDLCALC 190 (H) 10/17/2019   Lab Results  Component Value Date   TRIG 158 (H) 10/17/2019   Lab Results  Component Value Date   CHOLHDL 5.2 (H) 02/08/2019   No results found for: HGBA1C    Assessment & Plan:   Problem List Items Addressed This Visit      Other   Acute midline low back pain with bilateral sciatica - Primary    Patients back pain is not well controlled, started on prednisone pack  and flexeril 10 mg for severe lower back pain. Patient is unable to take Ibuprofen at this time. Provided education with printed hand out. Patient knows to follow up as needed with unresolved or worsening symptoms      Relevant Medications   predniSONE (STERAPRED UNI-PAK 21 TAB) 10 MG (21) TBPK tablet   cyclobenzaprine (FLEXERIL) 10 MG tablet      Meds ordered this encounter  Medications  . predniSONE (STERAPRED UNI-PAK 21 TAB) 10 MG (21) TBPK tablet      Sig: 6 tablet po day 1, 5 tablet day 2, 4 tablet day 3, 3 tablet 4, 2 tablet 5, 1 tablet day 6    Dispense:  1 each    Refill:  0  Order Specific Question:   Supervising Provider    Answer:   Arville Care A [id number]  . cyclobenzaprine (FLEXERIL) 10 MG tablet    Sig: Take 1 tablet (10 mg total) by mouth 3 (three) times daily as needed for muscle spasms.    Dispense:  30 tablet    Refill:  0    Order Specific Question:   Supervising Provider    Answer:   Arville Care A [[id number]]    Follow-up: Return if symptoms worsen or fail to improve.    Daryll Drown, NP

## 2020-05-04 NOTE — Patient Instructions (Addendum)
Patients back pain is not well controlled, started on prednisone pack  and flexeril 10 mg for severe lower back pain. Patient is unable to take Ibuprofen at this time. Provided education with printed hand out. Patient knows to follow up as needed with unresolved or worsening symptoms   Acute Back Pain, Adult Acute back pain is sudden and usually short-lived. It is often caused by an injury to the muscles and tissues in the back. The injury may result from:  A muscle or ligament getting overstretched or torn (strained). Ligaments are tissues that connect bones to each other. Lifting something improperly can cause a back strain.  Wear and tear (degeneration) of the spinal disks. Spinal disks are circular tissue that provides cushioning between the bones of the spine (vertebrae).  Twisting motions, such as while playing sports or doing yard work.  A hit to the back.  Arthritis. You may have a physical exam, lab tests, and imaging tests to find the cause of your pain. Acute back pain usually goes away with rest and home care. Follow these instructions at home: Managing pain, stiffness, and swelling  Take over-the-counter and prescription medicines only as told by your health care provider.  Your health care provider may recommend applying ice during the first 24-48 hours after your pain starts. To do this: ? Put ice in a plastic bag. ? Place a towel between your skin and the bag. ? Leave the ice on for 20 minutes, 2-3 times a day.  If directed, apply heat to the affected area as often as told by your health care provider. Use the heat source that your health care provider recommends, such as a moist heat pack or a heating pad. ? Place a towel between your skin and the heat source. ? Leave the heat on for 20-30 minutes. ? Remove the heat if your skin turns bright red. This is especially important if you are unable to feel pain, heat, or cold. You have a greater risk of getting  burned. Activity   Do not stay in bed. Staying in bed for more than 1-2 days can delay your recovery.  Sit up and stand up straight. Avoid leaning forward when you sit, or hunching over when you stand. ? If you work at a desk, sit close to it so you do not need to lean over. Keep your chin tucked in. Keep your neck drawn back, and keep your elbows bent at a right angle. Your arms should look like the letter "L." ? Sit high and close to the steering wheel when you drive. Add lower back (lumbar) support to your car seat, if needed.  Take short walks on even surfaces as soon as you are able. Try to increase the length of time you walk each day.  Do not sit, drive, or stand in one place for more than 30 minutes at a time. Sitting or standing for long periods of time can put stress on your back.  Do not drive or use heavy machinery while taking prescription pain medicine.  Use proper lifting techniques. When you bend and lift, use positions that put less stress on your back: ? [place] your knees. ? Keep the load close to your body. ? Avoid twisting.  Exercise regularly as told by your health care provider. Exercising helps your back heal faster and helps prevent back injuries by keeping muscles strong and flexible.  Work with a physical therapist to make a safe exercise program, as recommended by your  health care provider. Do any exercises as told by your physical therapist. Lifestyle  Maintain a healthy weight. Extra weight puts stress on your back and makes it difficult to have good posture.  Avoid activities or situations that make you feel anxious or stressed. Stress and anxiety increase muscle tension and can make back pain worse. Learn ways to manage anxiety and stress, such as through exercise. General instructions  Sleep on a firm mattress in a comfortable position. Try lying on your side with your knees slightly bent. If you lie on your back, put a pillow under your knees.  Follow  your treatment plan as told by your health care provider. This may include: ? Cognitive or behavioral therapy. ? Acupuncture or massage therapy. ? Meditation or yoga. Contact a health care provider if:  You have pain that is not relieved with rest or medicine.  You have increasing pain going down into your legs or buttocks.  Your pain does not improve after 2 weeks.  You have pain at night.  You lose weight without trying.  You have a fever or chills. Get help right away if:  You develop new bowel or bladder control problems.  You have unusual weakness or numbness in your arms or legs.  You develop nausea or vomiting.  You develop abdominal pain.  You feel faint. Summary  Acute back pain is sudden and usually short-lived.  Use proper lifting techniques. When you bend and lift, use positions that put less stress on your back.  Take over-the-counter and prescription medicines and apply heat or ice as directed by your health care provider. This information is not intended to replace advice given to you by your health care provider. Make sure you discuss any questions you have with your health care provider. Document Revised: 04/01/2019 Document Reviewed: 07/25/2017 Elsevier Patient Education  [place].

## 2020-05-04 NOTE — Assessment & Plan Note (Signed)
Patients back pain is not well controlled, started on prednisone pack  and flexeril 10 mg for severe lower back pain. Patient is unable to take Ibuprofen at this time. Provided education with printed hand out. Patient knows to follow up as needed with unresolved or worsening symptoms

## 2020-05-26 ENCOUNTER — Other Ambulatory Visit: Payer: Self-pay | Admitting: Family Medicine

## 2020-05-27 ENCOUNTER — Other Ambulatory Visit: Payer: Self-pay | Admitting: Family Medicine

## 2020-05-27 DIAGNOSIS — E785 Hyperlipidemia, unspecified: Secondary | ICD-10-CM

## 2020-06-09 ENCOUNTER — Encounter: Payer: Self-pay | Admitting: Family Medicine

## 2020-06-09 ENCOUNTER — Other Ambulatory Visit: Payer: Self-pay

## 2020-06-09 ENCOUNTER — Ambulatory Visit (INDEPENDENT_AMBULATORY_CARE_PROVIDER_SITE_OTHER): Payer: 59 | Admitting: Family Medicine

## 2020-06-09 VITALS — BP 130/89 | HR 90 | Temp 97.8°F | Ht 65.0 in | Wt 180.8 lb

## 2020-06-09 DIAGNOSIS — M2550 Pain in unspecified joint: Secondary | ICD-10-CM | POA: Insufficient documentation

## 2020-06-09 DIAGNOSIS — E669 Obesity, unspecified: Secondary | ICD-10-CM | POA: Insufficient documentation

## 2020-06-09 DIAGNOSIS — M5442 Lumbago with sciatica, left side: Secondary | ICD-10-CM | POA: Diagnosis not present

## 2020-06-09 DIAGNOSIS — F411 Generalized anxiety disorder: Secondary | ICD-10-CM | POA: Diagnosis not present

## 2020-06-09 DIAGNOSIS — I10 Essential (primary) hypertension: Secondary | ICD-10-CM

## 2020-06-09 DIAGNOSIS — F331 Major depressive disorder, recurrent, moderate: Secondary | ICD-10-CM | POA: Diagnosis not present

## 2020-06-09 DIAGNOSIS — G479 Sleep disorder, unspecified: Secondary | ICD-10-CM

## 2020-06-09 DIAGNOSIS — Z79899 Other long term (current) drug therapy: Secondary | ICD-10-CM

## 2020-06-09 DIAGNOSIS — M5441 Lumbago with sciatica, right side: Secondary | ICD-10-CM

## 2020-06-09 DIAGNOSIS — E785 Hyperlipidemia, unspecified: Secondary | ICD-10-CM

## 2020-06-09 MED ORDER — GABAPENTIN 300 MG PO CAPS: 300.0000 mg | ORAL_CAPSULE | Freq: Three times a day (TID) | ORAL | 5 refills | Status: DC

## 2020-06-09 MED ORDER — ATORVASTATIN CALCIUM 20 MG PO TABS: 20.0000 mg | ORAL_TABLET | Freq: Every day | ORAL | 1 refills | Status: DC

## 2020-06-09 MED ORDER — TRAZODONE HCL 100 MG PO TABS: ORAL_TABLET | ORAL | 1 refills | Status: DC

## 2020-06-09 MED ORDER — METOPROLOL SUCCINATE ER 25 MG PO TB24: 25.0000 mg | ORAL_TABLET | Freq: Every day | ORAL | 1 refills | Status: DC

## 2020-06-09 MED ORDER — ALPRAZOLAM 0.5 MG PO TABS: 0.5000 mg | ORAL_TABLET | Freq: Every day | ORAL | 1 refills | Status: DC | PRN

## 2020-06-09 MED ORDER — PHENTERMINE HCL 37.5 MG PO TABS: 37.5000 mg | ORAL_TABLET | Freq: Every day | ORAL | 0 refills | Status: DC

## 2020-06-09 MED ORDER — CITALOPRAM HYDROBROMIDE 40 MG PO TABS: 40.0000 mg | ORAL_TABLET | Freq: Every day | ORAL | 1 refills | Status: DC

## 2020-06-09 MED ORDER — CYCLOBENZAPRINE HCL 10 MG PO TABS: 10.0000 mg | ORAL_TABLET | Freq: Three times a day (TID) | ORAL | 5 refills | Status: DC | PRN

## 2020-06-09 MED ORDER — LOSARTAN POTASSIUM 25 MG PO TABS: 50.0000 mg | ORAL_TABLET | Freq: Every day | ORAL | 1 refills | Status: DC

## 2020-06-09 MED ORDER — BUSPIRONE HCL 7.5 MG PO TABS: 7.5000 mg | ORAL_TABLET | Freq: Two times a day (BID) | ORAL | 2 refills | Status: DC

## 2020-06-09 NOTE — Progress Notes (Signed)
Assessment & Plan:  1. Essential hypertension - Well controlled on current regimen.  - losartan (COZAAR) 25 MG tablet; Take 2 tablets (50 mg total) by mouth daily after supper.  Dispense: 180 tablet; Refill: 1 - metoprolol succinate (TOPROL XL) 25 MG 24 hr tablet; Take 1 tablet (25 mg total) by mouth daily.  Dispense: 90 tablet; Refill: 1  2. Acute midline low back pain with bilateral sciatica - Well controlled on current regimen.  - cyclobenzaprine (FLEXERIL) 10 MG tablet; Take 1 tablet (10 mg total) by mouth 3 (three) times daily as needed for muscle spasms.  Dispense: 30 tablet; Refill: 5  3-4. Generalized anxiety disorder/Moderate episode of recurrent major depressive disorder ([place]) - Uncontrolled. Patient advised to only take Xanax once daily as needed. Goal is for her to only use on as needed basis, not daily. Continue Celexa and Trazodone. Buspirone added today.  - ALPRAZolam (XANAX) 0.5 MG tablet; Take 1 tablet (0.5 mg total) by mouth daily as needed for anxiety.  Dispense: 30 tablet; Refill: 1 - citalopram (CELEXA) 40 MG tablet; Take 1 tablet (40 mg total) by mouth daily.  Dispense: 90 tablet; Refill: 1 - traZODone (DESYREL) 100 MG tablet; Take 1 tablet at bedtime  Dispense: 90 tablet; Refill: 1 - busPIRone (BUSPAR) 7.5 MG tablet; Take 1 tablet (7.5 mg total) by mouth 2 (two) times daily.  Dispense: 60 tablet; Refill: 2 - Compliance Drug Analysis, Ur  5. Hyperlipidemia, unspecified hyperlipidemia type - Well controlled on current regimen.  - atorvastatin (LIPITOR) 20 MG tablet; Take 1 tablet (20 mg total) by mouth daily.  Dispense: 90 tablet; Refill: 1  6. Difficulty sleeping - Well controlled on current regimen.  - traZODone (DESYREL) 100 MG tablet; Take 1 tablet at bedtime  Dispense: 90 tablet; Refill: 1  7. Polyarthralgia - Well controlled on current regimen.  - gabapentin (NEURONTIN) 300 MG capsule; Take 1 capsule (300 mg total) by mouth 3 (three) times daily.  Dispense:  90 capsule; Refill: 5  8. Obesity (BMI 30.0-34.9) - Patient to continue healthy eating. Increase exercise for a goal of 30 minutes 5 days a week.  - phentermine (ADIPEX-P) 37.5 MG tablet; Take 1 tablet (37.5 mg total) by mouth daily before breakfast.  Dispense: 30 tablet; Refill: 0  9. Controlled substance agreement signed - Signed for Phentermine and Xanax.  - Compliance Drug Analysis, Ur   Return as scheduled.  Hendricks Limes, MSN, APRN, FNP-C [facility]  Subjective:    Patient ID: Sarah Mcgee, female    DOB: 11-30-1966, 54 y.o.   MRN: [id number]  Patient Care Team: Loman Brooklyn, FNP as PCP - General (Family Medicine)   Chief Complaint:  Chief Complaint  Patient presents with  . Establish Care    jones pt   . Anxiety    check up of chronic medical conditions  . Weight Gain    Patient states that the last 6 months she has been eating better and not losing weight.    HPI: Sarah Mcgee is a 54 y.o. female presenting on 06/09/2020 for Establish Care (jones pt ), Anxiety (check up of chronic medical conditions), and Weight Gain (Patient states that the last 6 months she has been eating better and not losing weight.)  Trazodone is for sleep.  Gabapentin is for joint pains.   Flexeril for muscle spasms in her back.   Patient takes Xanax at least once daily when she is feeling overwhelmed or over stressed at work.  Depression screen [facility] 2/9 06/11/2020 05/04/2020 01/21/2019  Decreased Interest 1 0 0  Down, Depressed, Hopeless 1 0 0  PHQ - 2 Score 2 0 0  Altered sleeping 3 - -  Tired, decreased energy 2 - -  Change in appetite 0 - -  Feeling bad or failure about yourself  0 - -  Trouble concentrating 2 - -  Moving slowly or fidgety/restless 1 - -  Suicidal thoughts 0 - -  PHQ-9 Score 10 - -  Difficult doing work/chores Somewhat difficult - -  Some recent data might be hidden   GAD 7 : Generalized Anxiety Score 06/11/2020 03/17/2016 02/21/2016    Nervous, Anxious, on Edge 2 3 3   Control/stop worrying 2 3 3   Worry too much - different things 2 3 3   Trouble relaxing 2 3 3   Restless 2 3 3   Easily annoyed or irritable 2 3 3   Afraid - awful might happen 2 2 3   Total GAD 7 Score 14 20 21   Anxiety Difficulty Somewhat difficult Extremely difficult Very difficult    Patient is walking ~30 minutes 2-3 days a week, which she has been doing for the last six months. She does not eat any fried foods. She does not eat sweets. No regular sodas. She drinks mostly water.   Social history:  Relevant past medical, surgical, family and social history reviewed and updated as indicated. Interim medical history since our last visit reviewed.  Allergies and medications reviewed and updated.  DATA REVIEWED: CHART IN EPIC  ROS: Negative unless specifically indicated above in HPI.    Current Outpatient Medications:  .  albuterol (VENTOLIN HFA) 108 (90 Base) MCG/ACT inhaler, TAKE 2 PUFFS BY MOUTH EVERY 6 HOURS AS NEEDED FOR WHEEZE OR SHORTNESS OF BREATH (Needs to be seen before next refill), Disp: 8 g, Rfl: 0 .  ALPRAZolam (XANAX) 0.5 MG tablet, Take 1 tablet (0.5 mg total) by mouth 2 (two) times daily as needed for anxiety., Disp: 45 tablet, Rfl: 1 .  aspirin EC 81 MG tablet, Take 1 tablet (81 mg total) by mouth daily., Disp: 30 tablet, Rfl: 1 .  atorvastatin (LIPITOR) 20 MG tablet, Take 1 tablet (20 mg total) by mouth daily., Disp: 30 tablet, Rfl: 0 .  citalopram (CELEXA) 40 MG tablet, Take 1 tablet (40 mg total) by mouth daily. Needs to be seen for further refills, Disp: 30 tablet, Rfl: 0 .  cyclobenzaprine (FLEXERIL) 10 MG tablet, Take 1 tablet (10 mg total) by mouth 3 (three) times daily as needed for muscle spasms., Disp: 30 tablet, Rfl: 0 .  desonide (DESOWEN) 0.05 % cream, Apply topically 2 (two) times daily., Disp: 30 g, Rfl: 5 .  gabapentin (NEURONTIN) 300 MG capsule, Take 1 capsule (300 mg total) by mouth 3 (three) times daily. (Needs to be  seen before next refill), Disp: 90 capsule, Rfl: 0 .  losartan (COZAAR) 25 MG tablet, Take 2 tablets (50 mg total) by mouth daily after supper., Disp: 60 tablet, Rfl: 0 .  metoprolol succinate (TOPROL XL) 25 MG 24 hr tablet, Take 1 tablet (25 mg total) by mouth daily., Disp: 30 tablet, Rfl: 0 .  ondansetron (ZOFRAN) 4 MG tablet, Take 1 tablet (4 mg total) by mouth every 8 (eight) hours as needed for nausea or vomiting., Disp: 20 tablet, Rfl: 0 .  traZODone (DESYREL) 100 MG tablet, Take 1 tablet at bedtime, Disp: 30 tablet, Rfl: 0   Allergies  Allergen Reactions  . Codeine Itching and Nausea  And Vomiting   Past Medical History:  Diagnosis Date  . Anxiety   . Depression   . Hyperlipidemia     Past Surgical History:  Procedure Laterality Date  . ABDOMINAL HYSTERECTOMY    . arm surgery Right    shattered elbow  . CESAREAN SECTION      Social History   Socioeconomic History  . Marital status: Divorced    Spouse name: Not on file  . Number of children: 2  . Years of education: Not on file  . Highest education level: Not on file  Occupational History  . Not on file  Tobacco Use  . Smoking status: Current Every Day Smoker    Types: Cigarettes  . Smokeless tobacco: Never Used  Vaping Use  . Vaping Use: Former  . Quit date: 09/30/2018  Substance and Sexual Activity  . Alcohol use: Yes    Comment: occ  . Drug use: No  . Sexual activity: Not on file  Other Topics Concern  . Not on file  Social History Narrative  . Not on file   Social Determinants of Health   Financial Resource Strain:   . Difficulty of Paying Living Expenses:   Food Insecurity:   . Worried About Programme researcher, broadcasting/film/video in the Last Year:   . Barista in the Last Year:   Transportation Needs:   . Freight forwarder (Medical):   Marland Kitchen Lack of Transportation (Non-Medical):   Physical Activity:   . Days of Exercise per Week:   . Minutes of Exercise per Session:   Stress:   . Feeling of Stress :     Social Connections:   . Frequency of Communication with Friends and Family:   . Frequency of Social Gatherings with Friends and Family:   . Attends Religious Services:   . Active Member of Clubs or Organizations:   . Attends Banker Meetings:   Marland Kitchen Marital Status:   Intimate Partner Violence:   . Fear of Current or Ex-Partner:   . Emotionally Abused:   Marland Kitchen Physically Abused:   . Sexually Abused:         Objective:    BP 130/89   Pulse 90   Temp 97.8 F (36.6 C) (Temporal)   Ht 5\' 5"  (1.651 m)   Wt 180 lb 12.8 oz (82 kg)   SpO2 94%   BMI 30.09 kg/m   Wt Readings from Last 3 Encounters:  06/09/20 180 lb 12.8 oz (82 kg)  05/04/20 173 lb 12.8 oz (78.8 kg)  10/02/19 164 lb (74.4 kg)    Physical Exam Vitals reviewed.  Constitutional:      General: She is not in acute distress.    Appearance: Normal appearance. She is obese. She is not ill-appearing, toxic-appearing or diaphoretic.  HENT:     Head: Normocephalic and atraumatic.  Eyes:     General: No scleral icterus.       Right eye: No discharge.        Left eye: No discharge.     Conjunctiva/sclera: Conjunctivae normal.  Cardiovascular:     Rate and Rhythm: Normal rate and regular rhythm.     Heart sounds: Normal heart sounds. No murmur heard.  No friction rub. No gallop.   Pulmonary:     Effort: Pulmonary effort is normal. No respiratory distress.     Breath sounds: Normal breath sounds. No stridor. No wheezing, rhonchi or rales.  Musculoskeletal:  General: Normal range of motion.     Cervical back: Normal range of motion.  Skin:    General: Skin is warm and dry.     Capillary Refill: Capillary refill takes less than 2 seconds.  Neurological:     General: No focal deficit present.     Mental Status: She is alert and oriented to person, place, and time. Mental status is at baseline.  Psychiatric:        Mood and Affect: Mood normal.        Behavior: Behavior normal.        Thought Content:  Thought content normal.        Judgment: Judgment normal.     Lab Results  Component Value Date   TSH 1.390 02/08/2019   Lab Results  Component Value Date   WBC 10.8 02/08/2019   HGB 15.8 02/08/2019   HCT 45.7 02/08/2019   MCV 91 02/08/2019   PLT 420 02/08/2019   Lab Results  Component Value Date   NA 139 10/17/2019   K 4.8 10/17/2019   CO2 23 10/17/2019   GLUCOSE 102 (H) 10/17/2019   BUN 13 10/17/2019   CREATININE 0.72 10/17/2019   BILITOT 0.6 10/17/2019   ALKPHOS 98 10/17/2019   AST 23 10/17/2019   ALT 26 10/17/2019   PROT 7.3 10/17/2019   ALBUMIN 4.8 10/17/2019   CALCIUM 11.2 (H) 10/17/2019   Lab Results  Component Value Date   CHOL 275 (H) 10/17/2019   Lab Results  Component Value Date   HDL 56 10/17/2019   Lab Results  Component Value Date   LDLCALC 190 (H) 10/17/2019   Lab Results  Component Value Date   TRIG 158 (H) 10/17/2019   Lab Results  Component Value Date   CHOLHDL 5.2 (H) 02/08/2019   No results found for: HGBA1C

## 2020-06-11 DIAGNOSIS — Z79899 Other long term (current) drug therapy: Secondary | ICD-10-CM | POA: Insufficient documentation

## 2020-06-14 LAB — COMPLIANCE DRUG ANALYSIS, UR

## 2020-06-15 ENCOUNTER — Telehealth: Payer: Self-pay | Admitting: Family Medicine

## 2020-06-16 NOTE — Telephone Encounter (Signed)
Contacted patient and response is in result note.

## 2020-06-29 ENCOUNTER — Other Ambulatory Visit: Payer: Self-pay

## 2020-06-29 ENCOUNTER — Encounter: Payer: Self-pay | Admitting: Family Medicine

## 2020-06-29 ENCOUNTER — Ambulatory Visit (INDEPENDENT_AMBULATORY_CARE_PROVIDER_SITE_OTHER): Payer: 59 | Admitting: Family Medicine

## 2020-06-29 VITALS — BP 112/77 | HR 92 | Temp 98.3°F | Ht 65.0 in | Wt 175.6 lb

## 2020-06-29 DIAGNOSIS — F331 Major depressive disorder, recurrent, moderate: Secondary | ICD-10-CM

## 2020-06-29 DIAGNOSIS — E669 Obesity, unspecified: Secondary | ICD-10-CM

## 2020-06-29 DIAGNOSIS — G479 Sleep disorder, unspecified: Secondary | ICD-10-CM

## 2020-06-29 DIAGNOSIS — E782 Mixed hyperlipidemia: Secondary | ICD-10-CM

## 2020-06-29 DIAGNOSIS — I10 Essential (primary) hypertension: Secondary | ICD-10-CM

## 2020-06-29 DIAGNOSIS — Z0001 Encounter for general adult medical examination with abnormal findings: Secondary | ICD-10-CM | POA: Diagnosis not present

## 2020-06-29 DIAGNOSIS — Z0000 Encounter for general adult medical examination without abnormal findings: Secondary | ICD-10-CM

## 2020-06-29 DIAGNOSIS — F17201 Nicotine dependence, unspecified, in remission: Secondary | ICD-10-CM

## 2020-06-29 DIAGNOSIS — F411 Generalized anxiety disorder: Secondary | ICD-10-CM | POA: Diagnosis not present

## 2020-06-29 LAB — CBC WITH DIFFERENTIAL/PLATELET
Basophils Absolute: 0.1 10*3/uL (ref 0.0–0.2)
Basos: 1 %
EOS (ABSOLUTE): 0.3 10*3/uL (ref 0.0–0.4)
Eos: 2 %
Hematocrit: 46.4 % (ref 34.0–46.6)
Hemoglobin: 15.8 g/dL (ref 11.1–15.9)
Immature Grans (Abs): 0.1 10*3/uL (ref 0.0–0.1)
Immature Granulocytes: 1 %
Lymphocytes Absolute: 3 10*3/uL (ref 0.7–3.1)
Lymphs: 27 %
MCH: 31.5 pg (ref 26.6–33.0)
MCHC: 34.1 g/dL (ref 31.5–35.7)
MCV: 92 fL (ref 79–97)
Monocytes Absolute: 0.6 10*3/uL (ref 0.1–0.9)
Monocytes: 6 %
Neutrophils Absolute: 7.1 10*3/uL — ABNORMAL HIGH (ref 1.4–7.0)
Neutrophils: 63 %
Platelets: 307 10*3/uL (ref 150–450)
RBC: 5.02 x10E6/uL (ref 3.77–5.28)
RDW: 12.4 % (ref 11.7–15.4)
WBC: 11 10*3/uL — ABNORMAL HIGH (ref 3.4–10.8)

## 2020-06-29 LAB — CMP14+EGFR
ALT: 26 IU/L (ref 0–32)
AST: 21 IU/L (ref 0–40)
Albumin/Globulin Ratio: 2 (ref 1.2–2.2)
Albumin: 4.6 g/dL (ref 3.8–4.9)
Alkaline Phosphatase: 93 IU/L (ref 48–121)
BUN/Creatinine Ratio: 14 (ref 9–23)
BUN: 11 mg/dL (ref 6–24)
Bilirubin Total: 0.5 mg/dL (ref 0.0–1.2)
CO2: 21 mmol/L (ref 20–29)
Calcium: 9.9 mg/dL (ref 8.7–10.2)
Chloride: 100 mmol/L (ref 96–106)
Creatinine, Ser: 0.78 mg/dL (ref 0.57–1.00)
GFR calc Af Amer: 100 mL/min/{1.73_m2} (ref >59)
GFR calc non Af Amer: 86 mL/min/{1.73_m2} (ref >59)
Globulin, Total: 2.3 g/dL (ref 1.5–4.5)
Glucose: 87 mg/dL (ref 65–99)
Potassium: 4.6 mmol/L (ref 3.5–5.2)
Sodium: 139 mmol/L (ref 134–144)
Total Protein: 6.9 g/dL (ref 6.0–8.5)

## 2020-06-29 LAB — LIPID PANEL
Chol/HDL Ratio: 4.7 ratio — ABNORMAL HIGH (ref 0.0–4.4)
Cholesterol, Total: 238 mg/dL — ABNORMAL HIGH (ref 100–199)
HDL: 51 mg/dL (ref >39)
LDL Chol Calc (NIH): 148 mg/dL — ABNORMAL HIGH (ref 0–99)
Triglycerides: 213 mg/dL — ABNORMAL HIGH (ref 0–149)
VLDL Cholesterol Cal: 39 mg/dL (ref 5–40)

## 2020-06-29 MED ORDER — ALBUTEROL SULFATE HFA 108 (90 BASE) MCG/ACT IN AERS: 2.0000 | INHALATION_SPRAY | Freq: Four times a day (QID) | RESPIRATORY_TRACT | 5 refills | Status: AC | PRN

## 2020-06-29 MED ORDER — DESONIDE 0.05 % EX CREA: TOPICAL_CREAM | Freq: Two times a day (BID) | CUTANEOUS | 5 refills | Status: AC

## 2020-06-29 MED ORDER — LOSARTAN POTASSIUM 50 MG PO TABS: 50.0000 mg | ORAL_TABLET | Freq: Every day | ORAL | 1 refills | Status: DC

## 2020-06-29 MED ORDER — PHENTERMINE HCL 37.5 MG PO TABS: 37.5000 mg | ORAL_TABLET | Freq: Every day | ORAL | 0 refills | Status: DC

## 2020-06-29 MED ORDER — CHANTIX STARTING MONTH PAK 0.5 MG X 11 & 1 MG X 42 PO TABS: ORAL_TABLET | ORAL | 0 refills | Status: DC

## 2020-06-29 MED ORDER — BUSPIRONE HCL 15 MG PO TABS: 15.0000 mg | ORAL_TABLET | Freq: Two times a day (BID) | ORAL | 2 refills | Status: DC

## 2020-06-29 NOTE — Progress Notes (Signed)
Assessment & Plan:  1. Well adult exam - Preventive health education provided. Patient will do colonoscopy at another time as she is unable to take off work at this time. Declined HIV and hepatitis C screening. Patient will schedule for yearly mammogram. TDAP, COVID-19 vaccines, and pap smear are UTD.  - CBC with Differential/Platelet - CMP14+EGFR - Lipid panel  2. Essential hypertension - Well controlled on current regimen.  - losartan (COZAAR) 50 MG tablet; Take 1 tablet (50 mg total) by mouth daily after supper.  Dispense: 90 tablet; Refill: 1 - CMP14+EGFR - Lipid panel  3. Generalized anxiety disorder - Improving. Buspirone increased to 15 mg BID. Patient to increase from 7.5 mg BID to 15 mg in the AM and 7.5 mg in the PM for the next week, then she will increase to the 15 mg BID. She has Xanax PRN. - busPIRone (BUSPAR) 15 MG tablet; Take 1 tablet (15 mg total) by mouth 2 (two) times daily.  Dispense: 60 tablet; Refill: 2  4. Moderate episode of recurrent major depressive disorder (HCC) - Well controlled on current regimen.   5. Obesity (BMI 30.0-34.9) - Losing weight. Encouraged to continue diet and exercise.  - phentermine (ADIPEX-P) 37.5 MG tablet; Take 1 tablet (37.5 mg total) by mouth daily before breakfast.  Dispense: 30 tablet; Refill: 0  6. Mixed hyperlipidemia - Well controlled on current regimen.  - CMP14+EGFR - Lipid panel  7. Tobacco use disorder, moderate, in early remission - Education provided on Chantix.  - varenicline (CHANTIX STARTING MONTH PAK) 0.5 MG X 11 & 1 MG X 42 tablet; Take one 0.5 mg tablet by mouth once daily for 3 days, then increase to one 0.5 mg tablet twice daily for 4 days, then increase to one 1 mg tablet twice daily.  Dispense: 53 tablet; Refill: 0  8. Difficulty sleeping - Well controlled on current regimen.    Follow-up: Return in about 4 weeks (around 07/27/2020) for F/U weight loss, smoking, & anxiety.   Hendricks Limes, MSN, APRN,  FNP-C [facility]  Subjective:  Patient ID: Sarah Mcgee, female    DOB: 08-10-1966  Age: 54 y.o. MRN: [id number]  Patient Care Team: Loman Brooklyn, FNP as PCP - General (Family Medicine)   CC:  Chief Complaint  Patient presents with  . Gynecologic Exam  . Weight Loss    HPI Sarah Mcgee presents for her annual physical.   Occupation: [employer], Marital status: Divorced, Substance use: None Diet: eating healthy, Exercise: regular Last eye exam: last year - patient will schedule Last dental exam: last year - patient will schedule Last colonoscopy: 2010 - patient unable to take off work at this time to complete Last mammogram: 02/21/2019 Last pap smear: 02/07/2019 Lung Cancer Screening with low-dose Chest CT: does not qualify Hepatitis C Screening: declined Immunizations: Flu Vaccine: not flu season Tdap Vaccine: up to date  Shingrix Vaccine: up to date COVID-19 Vaccine: up to date  [facility] 2/9 Scores 06/29/2020 06/11/2020 05/04/2020 01/21/2019 07/09/2018 06/26/2018 05/03/2018  PHQ - 2 Score 2 2 0 0 0 3 1  PHQ- 9 Score 6 10 - - - 11 -     Patient reports she is doing well with depression but feels anxiety could be better. She was started on buspirone 7.5 mg BID a few weeks ago. She reports her Xanax was never filled at the pharmacy after her last visit. A prescription was sent over with 30 tablets and one refill on  06/09/2020 to [facility], which patient confirms is the correct pharmacy.   Depression screen [facility] 2/9 06/29/2020 06/11/2020 05/04/2020  Decreased Interest 1 1 0  Down, Depressed, Hopeless 1 1 0  PHQ - 2 Score 2 2 0  Altered sleeping 0 3 -  Tired, decreased energy 1 2 -  Change in appetite 1 0 -  Feeling bad or failure about yourself  0 0 -  Trouble concentrating 1 2 -  Moving slowly or fidgety/restless 1 1 -  Suicidal thoughts 0 0 -  PHQ-9 Score 6 10 -  Difficult doing work/chores - Somewhat difficult -  Some recent  data might be hidden   GAD 7 : Generalized Anxiety Score 06/29/2020 06/11/2020 03/17/2016 02/21/2016  Nervous, Anxious, on Edge 2 2 3 3   Control/stop worrying 2 2 3 3   Worry too much - different things 2 2 3 3   Trouble relaxing 1 2 3 3   Restless 1 2 3 3   Easily annoyed or irritable 1 2 3 3   Afraid - awful might happen 0 2 2 3   Total GAD 7 Score 9 14 20 21   Anxiety Difficulty - Somewhat difficult Extremely difficult Very difficult   Patient has been working hard on diet and exercise, as well as taking phentermine to aid in weight loss.   Review of Systems  Constitutional: Negative for chills, fever, malaise/fatigue and weight loss.  HENT: Negative for congestion, ear discharge, ear pain, nosebleeds, sinus pain, sore throat and tinnitus.   Eyes: Negative for blurred vision, double vision, pain, discharge and redness.  Respiratory: Negative for cough, shortness of breath and wheezing.   Cardiovascular: Negative for chest pain, palpitations and leg swelling.  Gastrointestinal: Negative for abdominal pain, constipation, diarrhea, heartburn, nausea and vomiting.  Genitourinary: Negative for dysuria, frequency and urgency.  Musculoskeletal: Negative for myalgias.  Skin: Negative for rash.  Neurological: Negative for dizziness, seizures, weakness and headaches.  Psychiatric/Behavioral: Negative for depression, substance abuse and suicidal ideas. The patient is not nervous/anxious.     Current Outpatient Medications:  .  albuterol (VENTOLIN HFA) 108 (90 Base) MCG/ACT inhaler, TAKE 2 PUFFS BY MOUTH EVERY 6 HOURS AS NEEDED FOR WHEEZE OR SHORTNESS OF BREATH (Needs to be seen before next refill), Disp: 8 g, Rfl: 0 .  ALPRAZolam (XANAX) 0.5 MG tablet, Take 1 tablet (0.5 mg total) by mouth daily as needed for anxiety., Disp: 30 tablet, Rfl: 1 .  aspirin EC 81 MG tablet, Take 1 tablet (81 mg total) by mouth daily., Disp: 30 tablet, Rfl: 1 .  atorvastatin (LIPITOR) 20 MG tablet, Take 1 tablet (20 mg  total) by mouth daily., Disp: 90 tablet, Rfl: 1 .  busPIRone (BUSPAR) 7.5 MG tablet, Take 1 tablet (7.5 mg total) by mouth 2 (two) times daily., Disp: 60 tablet, Rfl: 2 .  citalopram (CELEXA) 40 MG tablet, Take 1 tablet (40 mg total) by mouth daily., Disp: 90 tablet, Rfl: 1 .  cyclobenzaprine (FLEXERIL) 10 MG tablet, Take 1 tablet (10 mg total) by mouth 3 (three) times daily as needed for muscle spasms., Disp: 30 tablet, Rfl: 5 .  desonide (DESOWEN) 0.05 % cream, Apply topically 2 (two) times daily., Disp: 30 g, Rfl: 5 .  gabapentin (NEURONTIN) 300 MG capsule, Take 1 capsule (300 mg total) by mouth 3 (three) times daily., Disp: 90 capsule, Rfl: 5 .  losartan (COZAAR) 25 MG tablet, Take 2 tablets (50 mg total) by mouth daily after supper., Disp: 180 tablet, Rfl: 1 .  metoprolol succinate (TOPROL XL) 25 MG 24 hr tablet, Take 1 tablet (25 mg total) by mouth daily., Disp: 90 tablet, Rfl: 1 .  ondansetron (ZOFRAN) 4 MG tablet, Take 1 tablet (4 mg total) by mouth every 8 (eight) hours as needed for nausea or vomiting., Disp: 20 tablet, Rfl: 0 .  phentermine (ADIPEX-P) 37.5 MG tablet, Take 1 tablet (37.5 mg total) by mouth daily before breakfast., Disp: 30 tablet, Rfl: 0 .  traZODone (DESYREL) 100 MG tablet, Take 1 tablet at bedtime, Disp: 90 tablet, Rfl: 1  Allergies  Allergen Reactions  . Codeine Itching and Nausea And Vomiting    Past Medical History:  Diagnosis Date  . Anxiety   . Depression   . Hyperlipidemia     Past Surgical History:  Procedure Laterality Date  . ABDOMINAL HYSTERECTOMY    . arm surgery Right    shattered elbow  . CESAREAN SECTION      Family History  Problem Relation Age of Onset  . Hypertension Mother   . Hyperlipidemia Mother   . Breast cancer Mother 55  . Stroke Father   . Lung cancer Father   . Stroke Maternal Grandmother   . Heart attack Maternal Grandfather   . Cancer Paternal Grandmother   . Cancer Paternal Grandfather     Social History    Socioeconomic History  . Marital status: Divorced    Spouse name: Not on file  . Number of children: 2  . Years of education: Not on file  . Highest education level: Not on file  Occupational History  . Not on file  Tobacco Use  . Smoking status: Current Every Day Smoker    Packs/day: 1.00    Types: Cigarettes    Start date: 06/29/2006  . Smokeless tobacco: Never Used  Vaping Use  . Vaping Use: Former  . Quit date: 09/30/2018  Substance and Sexual Activity  . Alcohol use: Yes    Comment: occ  . Drug use: No  . Sexual activity: Not on file  Other Topics Concern  . Not on file  Social History Narrative  . Not on file   Social Determinants of Health   Financial Resource Strain:   . Difficulty of Paying Living Expenses:   Food Insecurity:   . Worried About Charity fundraiser in the Last Year:   . Arboriculturist in the Last Year:   Transportation Needs:   . Film/video editor (Medical):   Marland Kitchen Lack of Transportation (Non-Medical):   Physical Activity:   . Days of Exercise per Week:   . Minutes of Exercise per Session:   Stress:   . Feeling of Stress :   Social Connections:   . Frequency of Communication with Friends and Family:   . Frequency of Social Gatherings with Friends and Family:   . Attends Religious Services:   . Active Member of Clubs or Organizations:   . Attends Archivist Meetings:   Marland Kitchen Marital Status:   Intimate Partner Violence:   . Fear of Current or Ex-Partner:   . Emotionally Abused:   Marland Kitchen Physically Abused:   . Sexually Abused:       Objective:    BP 112/77   Pulse 92   Temp 98.3 F (36.8 C) (Temporal)   Ht 5' 5"  (1.651 m)   Wt 175 lb 9.6 oz (79.7 kg)   SpO2 96%   BMI 29.22 kg/m   Wt Readings from Last 3 Encounters:  06/29/20 175 lb 9.6 oz (79.7 kg)  06/09/20 180 lb 12.8 oz (82 kg)  05/04/20 173 lb 12.8 oz (78.8 kg)    Physical Exam Vitals reviewed.  Constitutional:      General: She is not in acute distress.     Appearance: Normal appearance. She is overweight. She is not ill-appearing, toxic-appearing or diaphoretic.  HENT:     Head: Normocephalic and atraumatic.     Right Ear: Tympanic membrane, ear canal and external ear normal. There is no impacted cerumen.     Left Ear: Tympanic membrane, ear canal and external ear normal. There is no impacted cerumen.     Nose: Nose normal. No congestion or rhinorrhea.     Mouth/Throat:     Mouth: Mucous membranes are moist.     Pharynx: Oropharynx is clear. No oropharyngeal exudate or posterior oropharyngeal erythema.  Eyes:     General: No scleral icterus.       Right eye: No discharge.        Left eye: No discharge.     Conjunctiva/sclera: Conjunctivae normal.     Pupils: Pupils are equal, round, and reactive to light.  Cardiovascular:     Rate and Rhythm: Normal rate and regular rhythm.     Heart sounds: Normal heart sounds. No murmur heard.  No friction rub. No gallop.   Pulmonary:     Effort: Pulmonary effort is normal. No respiratory distress.     Breath sounds: Normal breath sounds. No stridor. No wheezing, rhonchi or rales.  Abdominal:     General: Abdomen is flat. Bowel sounds are normal. There is no distension.     Palpations: Abdomen is soft. There is no mass.     Tenderness: There is no abdominal tenderness. There is no guarding or rebound.     Hernia: No hernia is present.  Musculoskeletal:        General: Normal range of motion.     Cervical back: Normal range of motion and neck supple. No rigidity. No muscular tenderness.  Lymphadenopathy:     Cervical: No cervical adenopathy.  Skin:    General: Skin is warm and dry.     Capillary Refill: Capillary refill takes less than 2 seconds.  Neurological:     General: No focal deficit present.     Mental Status: She is alert and oriented to person, place, and time. Mental status is at baseline.  Psychiatric:        Mood and Affect: Mood normal.        Behavior: Behavior normal.         Thought Content: Thought content normal.        Judgment: Judgment normal.     Lab Results  Component Value Date   TSH 1.390 02/08/2019   Lab Results  Component Value Date   WBC 10.8 02/08/2019   HGB 15.8 02/08/2019   HCT 45.7 02/08/2019   MCV 91 02/08/2019   PLT 420 02/08/2019   Lab Results  Component Value Date   NA 139 10/17/2019   K 4.8 10/17/2019   CO2 23 10/17/2019   GLUCOSE 102 (H) 10/17/2019   BUN 13 10/17/2019   CREATININE 0.72 10/17/2019   BILITOT 0.6 10/17/2019   ALKPHOS 98 10/17/2019   AST 23 10/17/2019   ALT 26 10/17/2019   PROT 7.3 10/17/2019   ALBUMIN 4.8 10/17/2019   CALCIUM 11.2 (H) 10/17/2019   Lab Results  Component Value Date   CHOL 275 (H) 10/17/2019  Lab Results  Component Value Date   HDL 56 10/17/2019   Lab Results  Component Value Date   LDLCALC 190 (H) 10/17/2019   Lab Results  Component Value Date   TRIG 158 (H) 10/17/2019   Lab Results  Component Value Date   CHOLHDL 5.2 (H) 02/08/2019   No results found for: HGBA1C

## 2020-06-29 NOTE — Patient Instructions (Addendum)
Schedule your mammogram - it has been more than 1 year.   Increase buspirone by 7.5 mg in the morning so you are taking 15 mg in the AM and 7.5 mg in the PM. Do this for 1 week, then increase to 15 mg twice daily.   Varenicline oral tablets What is this medicine? VARENICLINE (var e NI kleen) is used to help people quit smoking. It is used with a patient support program recommended by your physician. This medicine may be used for other purposes; ask your health care provider or pharmacist if you have questions. COMMON BRAND NAME(S): Chantix What should I tell my health care provider before I take this medicine? They need to know if you have any of these conditions:  heart disease  if you often drink alcohol  kidney disease  mental illness  on hemodialysis  seizures  history of stroke  suicidal thoughts, plans, or attempt; a previous suicide attempt by you or a family member  an unusual or allergic reaction to varenicline, other medicines, foods, dyes, or preservatives  pregnant or trying to get pregnant  breast-feeding How should I use this medicine? Take this medicine by mouth after eating. Take with a full glass of water. Follow the directions on the prescription label. Take your doses at regular intervals. Do not take your medicine more often than directed. There are 3 ways you can use this medicine to help you quit smoking; talk to your health care professional to decide which plan is right for you: 1) you can choose a quit date and start this medicine 1 week before the quit date, or, 2) you can start taking this medicine before you choose a quit date, and then pick a quit date between day 8 and 35 days of treatment, or, 3) if you are not sure that you are able or willing to quit smoking right away, start taking this medicine and slowly decrease the amount you smoke as directed by your health care professional with the goal of being cigarette-free by week 12 of  treatment. Stick to your plan; ask about support groups or other ways to help you remain cigarette-free. If you are motivated to quit smoking and did not succeed during a previous attempt with this medicine for reasons other than side effects, or if you returned to smoking after this treatment, speak with your health care professional about whether another course of this medicine may be right for you. A special MedGuide will be given to you by the pharmacist with each prescription and refill. Be sure to read this information carefully each time. Talk to your pediatrician regarding the use of this medicine in children. This medicine is not approved for use in children. Overdosage: If you think you have taken too much of this medicine contact a poison control center or emergency room at once. NOTE: This medicine is only for you. Do not share this medicine with others. What if I miss a dose? If you miss a dose, take it as soon as you can. If it is almost time for your next dose, take only that dose. Do not take double or extra doses. What may interact with this medicine?  alcohol  insulin  other medicines used to help people quit smoking  theophylline  warfarin This list may not describe all possible interactions. Give your health care provider a list of all the medicines, herbs, non-prescription drugs, or dietary supplements you use. Also tell them if you smoke, drink alcohol, or  use illegal drugs. Some items may interact with your medicine. What should I watch for while using this medicine? It is okay if you do not succeed at your attempt to quit and have a cigarette. You can still continue your quit attempt and keep using this medicine as directed. Just throw away your cigarettes and get back to your quit plan. Talk to your health care provider before using other treatments to quit smoking. Using this medicine with other treatments to quit smoking may increase the risk for side effects compared  to using a treatment alone. You may get drowsy or dizzy. Do not drive, use machinery, or do anything that needs mental alertness until you know how this medicine affects you. Do not stand or sit up quickly, especially if you are an older patient. This reduces the risk of dizzy or fainting spells. Decrease the number of alcoholic beverages that you drink during treatment with this medicine until you know if this medicine affects your ability to tolerate alcohol. Some people have experienced increased drunkenness (intoxication), unusual or sometimes aggressive behavior, or no memory of things that have happened (amnesia) during treatment with this medicine. Sleepwalking can happen during treatment with this medicine, and can sometimes lead to behavior that is harmful to you, other people, or property. Stop taking this medicine and tell your doctor if you start sleepwalking or have other unusual sleep-related activity. After taking this medicine, you may get up out of bed and do an activity that you do not know you are doing. The next morning, you may have no memory of this. Activities include driving a car ("sleep-driving"), making and eating food, talking on the phone, sexual activity, and sleep-walking. Serious injuries have occurred. Stop the medicine and call your doctor right away if you find out you have done any of these activities. Do not take this medicine if you have used alcohol that evening. Do not take it if you have taken another medicine for sleep. The risk of doing these sleep-related activities is higher. Patients and their families should watch out for new or worsening depression or thoughts of suicide. Also watch out for sudden changes in feelings such as feeling anxious, agitated, panicky, irritable, hostile, aggressive, impulsive, severely restless, overly excited and hyperactive, or not being able to sleep. If this happens, call your health care professional. If you have diabetes and you  quit smoking, the effects of insulin may be increased and you may need to reduce your insulin dose. Check with your doctor or health care professional about how you should adjust your insulin dose. What side effects may I notice from receiving this medicine? Side effects that you should report to your doctor or health care professional as soon as possible:  allergic reactions like skin rash, itching or hives, swelling of the face, lips, tongue, or throat  acting aggressive, being angry or violent, or acting on dangerous impulses  breathing problems  changes in emotions or moods  chest pain or chest tightness  feeling faint or lightheaded, falls  hallucination, loss of contact with reality  mouth sores  redness, blistering, peeling or loosening of the skin, including inside the mouth  signs and symptoms of a stroke like changes in vision; confusion; trouble speaking or understanding; severe headaches; sudden numbness or weakness of the face, arm or leg; trouble walking; dizziness; loss of balance or coordination  seizures  sleepwalking  suicidal thoughts or other mood changes Side effects that usually do not require medical attention (report  to your doctor or health care professional if they continue or are bothersome):  constipation  gas  headache  nausea, vomiting  strange dreams  trouble sleeping This list may not describe all possible side effects. Call your doctor for medical advice about side effects. You may report side effects to FDA at 1-800-FDA-1088. Where should I keep my medicine? Keep out of the reach of children. Store at room temperature between 15 and 30 degrees C (59 and 86 degrees F). Throw away any unused medicine after the expiration date. NOTE: This sheet is a summary. It may not cover all possible information. If you have questions about this medicine, talk to your doctor, pharmacist, or health care provider.  2020 Elsevier/Gold Standard  (2018-11-29 14:27:36)   Preventive Care 11-88 Years Old, Female Preventive care refers to visits with your health care provider and lifestyle choices that can promote health and wellness. This includes:  A yearly physical exam. This may also be called an annual well check.  Regular dental visits and eye exams.  Immunizations.  Screening for certain conditions.  Healthy lifestyle choices, such as eating a healthy diet, getting regular exercise, not using drugs or products that contain nicotine and tobacco, and limiting alcohol use. What can I expect for my preventive care visit? Physical exam Your health care provider will check your:  Height and weight. This may be used to calculate body mass index (BMI), which tells if you are at a healthy weight.  Heart rate and blood pressure.  Skin for abnormal spots. Counseling Your health care provider may ask you questions about your:  Alcohol, tobacco, and drug use.  Emotional well-being.  Home and relationship well-being.  Sexual activity.  Eating habits.  Work and work Statistician.  Method of birth control.  Menstrual cycle.  Pregnancy history. What immunizations do I need?  Influenza (flu) vaccine  This is recommended every year. Tetanus, diphtheria, and pertussis (Tdap) vaccine  You may need a Td booster every 10 years. Varicella (chickenpox) vaccine  You may need this if you have not been vaccinated. Zoster (shingles) vaccine  You may need this after age 33. Measles, mumps, and rubella (MMR) vaccine  You may need at least one dose of MMR if you were born in 1957 or later. You may also need a second dose. Pneumococcal conjugate (PCV13) vaccine  You may need this if you have certain conditions and were not previously vaccinated. Pneumococcal polysaccharide (PPSV23) vaccine  You may need one or two doses if you smoke cigarettes or if you have certain conditions. Meningococcal conjugate (MenACWY)  vaccine  You may need this if you have certain conditions. Hepatitis A vaccine  You may need this if you have certain conditions or if you travel or work in places where you may be exposed to hepatitis A. Hepatitis B vaccine  You may need this if you have certain conditions or if you travel or work in places where you may be exposed to hepatitis B. Haemophilus influenzae type b (Hib) vaccine  You may need this if you have certain conditions. Human papillomavirus (HPV) vaccine  If recommended by your health care provider, you may need three doses over 6 months. You may receive vaccines as individual doses or as more than one vaccine together in one shot (combination vaccines). Talk with your health care provider about the risks and benefits of combination vaccines. What tests do I need? Blood tests  Lipid and cholesterol levels. These may be checked every 5 years,  or more frequently if you are over 72 years old.  Hepatitis C test.  Hepatitis B test. Screening  Lung cancer screening. You may have this screening every year starting at age 56 if you have a 30-pack-year history of smoking and currently smoke or have quit within the past 15 years.  Colorectal cancer screening. All adults should have this screening starting at age 59 and continuing until age 82. Your health care provider may recommend screening at age 57 if you are at increased risk. You will have tests every 1-10 years, depending on your results and the type of screening test.  Diabetes screening. This is done by checking your blood sugar (glucose) after you have not eaten for a while (fasting). You may have this done every 1-3 years.  Mammogram. This may be done every 1-2 years. Talk with your health care provider about when you should start having regular mammograms. This may depend on whether you have a family history of breast cancer.  BRCA-related cancer screening. This may be done if you have a family history of  breast, ovarian, tubal, or peritoneal cancers.  Pelvic exam and Pap test. This may be done every 3 years starting at age 71. Starting at age 80, this may be done every 5 years if you have a Pap test in combination with an HPV test. Other tests  Sexually transmitted disease (STD) testing.  Bone density scan. This is done to screen for osteoporosis. You may have this scan if you are at high risk for osteoporosis. Follow these instructions at home: Eating and drinking  Eat a diet that includes fresh fruits and vegetables, whole grains, lean protein, and low-fat dairy.  Take vitamin and mineral supplements as recommended by your health care provider.  Do not drink alcohol if: ? Your health care provider tells you not to drink. ? You are pregnant, may be pregnant, or are planning to become pregnant.  If you drink alcohol: ? Limit how much you have to 0-1 drink a day. ? Be aware of how much alcohol is in your drink. In the [place] one drink equals one 12 oz bottle of beer (355 mL), one 5 oz glass of wine (148 mL), or one 1 oz glass of hard liquor (44 mL). Lifestyle  Take daily care of your teeth and gums.  Stay active. Exercise for at least 30 minutes on 5 or more days each week.  Do not use any products that contain nicotine or tobacco, such as cigarettes, e-cigarettes, and chewing tobacco. If you need help quitting, ask your health care provider.  If you are sexually active, practice safe sex. Use a condom or other form of birth control (contraception) in order to prevent pregnancy and STIs (sexually transmitted infections).  If told by your health care provider, take low-dose aspirin daily starting at age 3. What's next?  Visit your health care provider once a year for a well check visit.  Ask your health care provider how often you should have your eyes and teeth checked.  Stay up to date on all vaccines. This information is not intended to replace advice given to you by your  health care provider. Make sure you discuss any questions you have with your health care provider. Document Revised: 08/22/2018 Document Reviewed: 08/22/2018 Elsevier Patient Education  2020 [employer].

## 2020-06-30 ENCOUNTER — Other Ambulatory Visit: Payer: Self-pay | Admitting: Family Medicine

## 2020-06-30 DIAGNOSIS — E785 Hyperlipidemia, unspecified: Secondary | ICD-10-CM

## 2020-06-30 MED ORDER — ATORVASTATIN CALCIUM 40 MG PO TABS: 40.0000 mg | ORAL_TABLET | Freq: Every day | ORAL | 1 refills | Status: DC

## 2020-07-30 ENCOUNTER — Other Ambulatory Visit: Payer: Self-pay

## 2020-07-30 ENCOUNTER — Encounter: Payer: Self-pay | Admitting: Family Medicine

## 2020-07-30 ENCOUNTER — Ambulatory Visit (INDEPENDENT_AMBULATORY_CARE_PROVIDER_SITE_OTHER): Payer: 59 | Admitting: Family Medicine

## 2020-07-30 VITALS — BP 143/88 | HR 108 | Temp 97.7°F | Ht 65.0 in | Wt 178.0 lb

## 2020-07-30 DIAGNOSIS — L989 Disorder of the skin and subcutaneous tissue, unspecified: Secondary | ICD-10-CM

## 2020-07-30 DIAGNOSIS — F331 Major depressive disorder, recurrent, moderate: Secondary | ICD-10-CM

## 2020-07-30 DIAGNOSIS — E669 Obesity, unspecified: Secondary | ICD-10-CM

## 2020-07-30 DIAGNOSIS — F17201 Nicotine dependence, unspecified, in remission: Secondary | ICD-10-CM | POA: Diagnosis not present

## 2020-07-30 DIAGNOSIS — F411 Generalized anxiety disorder: Secondary | ICD-10-CM

## 2020-07-30 MED ORDER — PHENTERMINE HCL 37.5 MG PO TABS: 37.5000 mg | ORAL_TABLET | Freq: Every day | ORAL | 0 refills | Status: DC

## 2020-07-30 MED ORDER — BUPROPION HCL ER (SR) 150 MG PO TB12: 150.0000 mg | ORAL_TABLET | Freq: Two times a day (BID) | ORAL | 2 refills | Status: DC

## 2020-07-30 NOTE — Progress Notes (Signed)
Assessment & Plan:  1. Obesity (BMI 30.0-34.9) - Patient to continue diet and exercise. Continue phentermine. Adding Wellbutrin for smoking cessation but also to see if it will help with weight loss.  - buPROPion (WELLBUTRIN SR) 150 MG 12 hr tablet; Take 1 tablet (150 mg total) by mouth 2 (two) times daily.  Dispense: 60 tablet; Refill: 2 - phentermine (ADIPEX-P) 37.5 MG tablet; Take 1 tablet (37.5 mg total) by mouth daily before breakfast.  Dispense: 30 tablet; Refill: 0  2. Tobacco use disorder, moderate, in early remission - Starting Wellbutrin to aide in smoking cessation. Education provided.  - buPROPion (WELLBUTRIN SR) 150 MG 12 hr tablet; Take 1 tablet (150 mg total) by mouth 2 (two) times daily.  Dispense: 60 tablet; Refill: 2  3. Generalized anxiety disorder - Improving. Wellbutrin added for smoking cessation and weight loss, we will see if this also help with her anxiety some.  - buPROPion (WELLBUTRIN SR) 150 MG 12 hr tablet; Take 1 tablet (150 mg total) by mouth 2 (two) times daily.  Dispense: 60 tablet; Refill: 2  4. Moderate episode of recurrent major depressive disorder (HCC) - Well controlled on current regimen.   5. Chest skin lesion - Ambulatory referral to Dermatology   Return in about 4 weeks (around 08/27/2020) for weight, anxiety, and smoking.  Sarah BostonBritney Khole Arterburn, MSN, APRN, FNP-C [facility]  Subjective:    Patient ID: Sarah Mcgee, female    DOB: 20-Oct-1966, 54 y.o.   MRN: [id number]  Patient Care Team: Gwenlyn FudgeJoyce, Gerald Kuehl F, FNP as PCP - General (Family Medicine)   Chief Complaint:  Chief Complaint  Patient presents with  . Weight Check    4 week re check  . Anxiety    Patient states that her anxiety is better.    HPI: Sarah DraftRhonda Mcgee is a 54 y.o. female presenting on 07/30/2020 for Weight Check (4 week re check) and Anxiety (Patient states that her anxiety is better.)  Patient is here for a follow-up of weight loss, smoking cessation, and  anxiety.   Patient has been dieting and exercising, as well as taking phentermine. She has not lost any weight since our last visit but gained 3 lbs.   She was unable to pick up Chantix as it has been recalled. She is still smoking 1 pack/day.   Her anxiety is better with the increase in buspirone, but she feels it could be better.   GAD 7 : Generalized Anxiety Score 07/30/2020 06/29/2020 06/11/2020 03/17/2016  Nervous, Anxious, on Edge 1 2 2 3   Control/stop worrying 1 2 2 3   Worry too much - different things 1 2 2 3   Trouble relaxing 1 1 2 3   Restless 1 1 2 3   Easily annoyed or irritable 1 1 2 3   Afraid - awful might happen 0 0 2 2  Total GAD 7 Score 6 9 14 20   Anxiety Difficulty - - Somewhat difficult Extremely difficult    Dep[facility] 07/30/2020 06/29/2020 06/11/2020  Decreased Interest 1 1 1   Down, Depressed, Hopeless 0 1 1  PHQ - 2 Score 1 2 2   Altered sleeping 0 0 3  Tired, decreased energy 1 1 2   Change in appetite 0 1 0  Feeling bad or failure about yourself  0 0 0  Trouble concentrating 1 1 2   Moving slowly or fidgety/restless 1 1 1   Suicidal thoughts 0 0 0  PHQ-9 Score 4 6 10   Difficult doing work/chores - -  Somewhat difficult  Some recent data might be hidden   New complaints: Patient has developed a lesion on her chest after being out in the sun on vacation that is not healing.    Social history:  Relevant past medical, surgical, family and social history reviewed and updated as indicated. Interim medical history since our last visit reviewed.  Allergies and medications reviewed and updated.  DATA REVIEWED: CHART IN EPIC  ROS: Negative unless specifically indicated above in HPI.    Current Outpatient Medications:  .  albuterol (VENTOLIN HFA) 108 (90 Base) MCG/ACT inhaler, Inhale 2 puffs into the lungs every 6 (six) hours as needed for wheezing or shortness of breath., Disp: 18 g, Rfl: 5 .  ALPRAZolam (XANAX) 0.5 MG tablet, Take 1 tablet (0.5 mg  total) by mouth daily as needed for anxiety., Disp: 30 tablet, Rfl: 1 .  aspirin EC 81 MG tablet, Take 1 tablet (81 mg total) by mouth daily., Disp: 30 tablet, Rfl: 1 .  atorvastatin (LIPITOR) 40 MG tablet, Take 1 tablet (40 mg total) by mouth daily., Disp: 90 tablet, Rfl: 1 .  busPIRone (BUSPAR) 15 MG tablet, Take 1 tablet (15 mg total) by mouth 2 (two) times daily., Disp: 60 tablet, Rfl: 2 .  citalopram (CELEXA) 40 MG tablet, Take 1 tablet (40 mg total) by mouth daily., Disp: 90 tablet, Rfl: 1 .  cyclobenzaprine (FLEXERIL) 10 MG tablet, Take 1 tablet (10 mg total) by mouth 3 (three) times daily as needed for muscle spasms., Disp: 30 tablet, Rfl: 5 .  desonide (DESOWEN) 0.05 % cream, Apply topically 2 (two) times daily., Disp: 30 g, Rfl: 5 .  gabapentin (NEURONTIN) 300 MG capsule, Take 1 capsule (300 mg total) by mouth 3 (three) times daily., Disp: 90 capsule, Rfl: 5 .  losartan (COZAAR) 50 MG tablet, Take 1 tablet (50 mg total) by mouth daily after supper., Disp: 90 tablet, Rfl: 1 .  metoprolol succinate (TOPROL XL) 25 MG 24 hr tablet, Take 1 tablet (25 mg total) by mouth daily., Disp: 90 tablet, Rfl: 1 .  phentermine (ADIPEX-P) 37.5 MG tablet, Take 1 tablet (37.5 mg total) by mouth daily before breakfast., Disp: 30 tablet, Rfl: 0 .  traZODone (DESYREL) 100 MG tablet, Take 1 tablet at bedtime, Disp: 90 tablet, Rfl: 1   Allergies  Allergen Reactions  . Codeine Itching and Nausea And Vomiting   Past Medical History:  Diagnosis Date  . Anxiety   . Depression   . Hyperlipidemia     Past Surgical History:  Procedure Laterality Date  . ABDOMINAL HYSTERECTOMY    . arm surgery Right    shattered elbow  . CESAREAN SECTION      Social History   Socioeconomic History  . Marital status: Divorced    Spouse name: Not on file  . Number of children: 2  . Years of education: Not on file  . Highest education level: Not on file  Occupational History  . Not on file  Tobacco Use  . Smoking  status: Current Every Day Smoker    Packs/day: 1.00    Types: Cigarettes    Start date: 06/29/2006  . Smokeless tobacco: Never Used  Vaping Use  . Vaping Use: Former  . Quit date: 09/30/2018  Substance and Sexual Activity  . Alcohol use: Yes    Comment: occ  . Drug use: No  . Sexual activity: Not on file  Other Topics Concern  . Not on file  Social History Narrative  .  Not on file   Social Determinants of Health   Financial Resource Strain:   . Difficulty of Paying Living Expenses:   Food Insecurity:   . Worried About Programme researcher, broadcasting/film/video in the Last Year:   . Barista in the Last Year:   Transportation Needs:   . Freight forwarder (Medical):   Marland Kitchen Lack of Transportation (Non-Medical):   Physical Activity:   . Days of Exercise per Week:   . Minutes of Exercise per Session:   Stress:   . Feeling of Stress :   Social Connections:   . Frequency of Communication with Friends and Family:   . Frequency of Social Gatherings with Friends and Family:   . Attends Religious Services:   . Active Member of Clubs or Organizations:   . Attends Banker Meetings:   Marland Kitchen Marital Status:   Intimate Partner Violence:   . Fear of Current or Ex-Partner:   . Emotionally Abused:   Marland Kitchen Physically Abused:   . Sexually Abused:         Objective:    BP (!) 143/88   Pulse (!) 108   Temp 97.7 F (36.5 C) (Temporal)   Ht 5\' 5"  (1.651 m)   Wt 178 lb (80.7 kg)   SpO2 99%   BMI 29.62 kg/m   Wt Readings from Last 3 Encounters:  07/30/20 178 lb (80.7 kg)  06/29/20 175 lb 9.6 oz (79.7 kg)  06/09/20 180 lb 12.8 oz (82 kg)    Physical Exam Vitals reviewed.  Constitutional:      General: She is not in acute distress.    Appearance: Normal appearance. She is overweight. She is not ill-appearing, toxic-appearing or diaphoretic.  HENT:     Head: Normocephalic and atraumatic.  Eyes:     General: No scleral icterus.       Right eye: No discharge.        Left eye: No  discharge.     Conjunctiva/sclera: Conjunctivae normal.  Cardiovascular:     Rate and Rhythm: Normal rate and regular rhythm.     Heart sounds: Normal heart sounds. No murmur heard.  No friction rub. No gallop.   Pulmonary:     Effort: Pulmonary effort is normal. No respiratory distress.     Breath sounds: Normal breath sounds. No stridor. No wheezing, rhonchi or rales.  Musculoskeletal:        General: Normal range of motion.     Cervical back: Normal range of motion.  Skin:    General: Skin is warm and dry.     Capillary Refill: Capillary refill takes less than 2 seconds.     Findings: Lesion (chest that is red, raised, and moist appearing) present.  Neurological:     General: No focal deficit present.     Mental Status: She is alert and oriented to person, place, and time. Mental status is at baseline.  Psychiatric:        Mood and Affect: Mood normal.        Behavior: Behavior normal.        Thought Content: Thought content normal.        Judgment: Judgment normal.     Lab Results  Component Value Date   TSH 1.390 02/08/2019   Lab Results  Component Value Date   WBC 11.0 (H) 06/29/2020   HGB 15.8 06/29/2020   HCT 46.4 06/29/2020   MCV 92 06/29/2020   PLT 307 06/29/2020  Lab Results  Component Value Date   NA 139 06/29/2020   K 4.6 06/29/2020   CO2 21 06/29/2020   GLUCOSE 87 06/29/2020   BUN 11 06/29/2020   CREATININE 0.78 06/29/2020   BILITOT 0.5 06/29/2020   ALKPHOS 93 06/29/2020   AST 21 06/29/2020   ALT 26 06/29/2020   PROT 6.9 06/29/2020   ALBUMIN 4.6 06/29/2020   CALCIUM 9.9 06/29/2020   Lab Results  Component Value Date   CHOL 238 (H) 06/29/2020   Lab Results  Component Value Date   HDL 51 06/29/2020   Lab Results  Component Value Date   LDLCALC 148 (H) 06/29/2020   Lab Results  Component Value Date   TRIG 213 (H) 06/29/2020   Lab Results  Component Value Date   CHOLHDL 4.7 (H) 06/29/2020   No results found for: HGBA1C

## 2020-07-30 NOTE — Patient Instructions (Addendum)
Start bupropion (Wellbutrin) 150 mg once daily x3 days, then increase to twice daily. Stop smoking after 1 week of treatment.    Bupropion sustained-release tablets (smoking cessation) What is this medicine? BUPROPION (byoo PROE pee on) is used to help people quit smoking. This medicine may be used for other purposes; ask your health care provider or pharmacist if you have questions. COMMON BRAND NAME(S): Buproban, Zyban What should I tell my health care provider before I take this medicine? They need to know if you have any of these conditions:  an eating disorder, such as anorexia or bulimia  bipolar disorder or psychosis  diabetes or high blood sugar, treated with medication  glaucoma  head injury or brain tumor  heart disease, previous heart attack, or irregular heart beat  high blood pressure  kidney or liver disease  seizures  suicidal thoughts or a previous suicide attempt  Tourette's syndrome  weight loss  an unusual or allergic reaction to bupropion, other medicines, foods, dyes, or preservatives  breast-feeding  pregnant or trying to become pregnant How should I use this medicine? Take this medicine by mouth with a glass of water. Follow the directions on the prescription label. You can take it with or without food. If it upsets your stomach, take it with food. Do not cut, crush or chew this medicine. Take your medicine at regular intervals. If you take this medicine more than once a day, take your second dose at least 8 hours after you take your first dose. To limit difficulty in sleeping, avoid taking this medicine at bedtime. Do not take your medicine more often than directed. Do not stop taking this medicine suddenly except upon the advice of your doctor. Stopping this medicine too quickly may cause serious side effects. A special MedGuide will be given to you by the pharmacist with each prescription and refill. Be sure to read this information carefully each  time. Talk to your pediatrician regarding the use of this medicine in children. Special care may be needed. Overdosage: If you think you have taken too much of this medicine contact a poison control center or emergency room at once. NOTE: This medicine is only for you. Do not share this medicine with others. What if I miss a dose? If you miss a dose, skip the missed dose and take your next tablet at the regular time. There should be at least 8 hours between doses. Do not take double or extra doses. What may interact with this medicine? Do not take this medicine with any of the following medications:  linezolid  MAOIs like Azilect, Carbex, Eldepryl, Marplan, Nardil, and Parnate  methylene blue (injected into a vein)  other medicines that contain bupropion like Wellbutrin This medicine may also interact with the following medications:  alcohol  certain medicines for anxiety or sleep  certain medicines for blood pressure like metoprolol, propranolol  certain medicines for depression or psychotic disturbances  certain medicines for HIV or AIDS like efavirenz, lopinavir, nelfinavir, ritonavir  certain medicines for irregular heart beat like propafenone, flecainide  certain medicines for Parkinson's disease like amantadine, levodopa  certain medicines for seizures like carbamazepine, phenytoin, phenobarbital  cimetidine  clopidogrel  cyclophosphamide  digoxin  furazolidone  isoniazid  nicotine  orphenadrine  procarbazine  steroid medicines like prednisone or cortisone  stimulant medicines for attention disorders, weight loss, or to stay awake  tamoxifen  theophylline  thiotepa  ticlopidine  tramadol  warfarin This list may not describe all possible interactions. Give  your health care provider a list of all the medicines, herbs, non-prescription drugs, or dietary supplements you use. Also tell them if you smoke, drink alcohol, or use illegal drugs. Some  items may interact with your medicine. What should I watch for while using this medicine? Visit your doctor or healthcare provider for regular checks on your progress. This medicine should be used together with a patient support program. It is important to participate in a behavioral program, counseling, or other support program that is recommended by your healthcare provider. This medicine may cause serious skin reactions. They can happen weeks to months after starting the medicine. Contact your healthcare provider right away if you notice fevers or flu-like symptoms with a rash. The rash may be red or purple and then turn into blisters or peeling of the skin. Or, you might notice a red rash with swelling of the face, lips or lymph nodes in your neck or under your arms. Patients and their families should watch out for new or worsening thoughts of suicide or depression. Also watch out for sudden changes in feelings such as feeling anxious, agitated, panicky, irritable, hostile, aggressive, impulsive, severely restless, overly excited and hyperactive, or not being able to sleep. If this happens, especially at the beginning of treatment or after a change in dose, call your healthcare provider. Avoid alcoholic drinks while taking this medicine. Drinking excessive alcoholic beverages, using sleeping or anxiety medicines, or quickly stopping the use of these agents while taking this medicine may increase your risk for a seizure. Do not drive or use heavy machinery until you know how this medicine affects you. This medicine can impair your ability to perform these tasks. Do not take this medicine close to bedtime. It may prevent you from sleeping. Your mouth may get dry. Chewing sugarless gum or sucking hard candy, and drinking plenty of water may help. Contact your doctor if the problem does not go away or is severe. Do not use nicotine patches or chewing gum without the advice of your doctor or healthcare  provider while taking this medicine. You may need to have your blood pressure taken regularly if your doctor recommends that you use both nicotine and this medicine together. What side effects may I notice from receiving this medicine? Side effects that you should report to your doctor or health care professional as soon as possible:  allergic reactions like skin rash, itching or hives, swelling of the face, lips, or tongue  breathing problems  changes in vision  confusion  elevated mood, decreased need for sleep, racing thoughts, impulsive behavior  fast or irregular heartbeat  hallucinations, loss of contact with reality  increased blood pressure  rash, fever, and swollen lymph nodes  redness, blistering, peeling, or loosening of the skin, including inside the mouth  seizures  suicidal thoughts or other mood changes  unusually weak or tired  vomiting Side effects that usually do not require medical attention (report to your doctor or health care professional if they continue or are bothersome):  constipation  headache  loss of appetite  nausea  tremors  weight loss This list may not describe all possible side effects. Call your doctor for medical advice about side effects. You may report side effects to FDA at 1-800-FDA-1088. Where should I keep my medicine? Keep out of the reach of children. Store at room temperature between 20 and 25 degrees C (68 and 77 degrees F). Protect from light. Keep container tightly closed. Throw away any unused medicine  after the expiration date. NOTE: This sheet is a summary. It may not cover all possible information. If you have questions about this medicine, talk to your doctor, pharmacist, or health care provider.  2020 Elsevier/Gold Standard (2019-03-06 13:59:09)

## 2020-08-02 ENCOUNTER — Encounter: Payer: Self-pay | Admitting: Family Medicine

## 2020-09-01 ENCOUNTER — Ambulatory Visit: Payer: 59 | Admitting: Family Medicine

## 2020-09-06 ENCOUNTER — Encounter: Payer: Self-pay | Admitting: Family Medicine

## 2020-09-27 ENCOUNTER — Other Ambulatory Visit: Payer: Self-pay | Admitting: Family Medicine

## 2020-09-27 DIAGNOSIS — F411 Generalized anxiety disorder: Secondary | ICD-10-CM

## 2020-10-06 ENCOUNTER — Ambulatory Visit (INDEPENDENT_AMBULATORY_CARE_PROVIDER_SITE_OTHER): Payer: 59 | Admitting: Family Medicine

## 2020-10-06 ENCOUNTER — Encounter: Payer: Self-pay | Admitting: Family Medicine

## 2020-10-06 DIAGNOSIS — R059 Cough, unspecified: Secondary | ICD-10-CM

## 2020-10-06 MED ORDER — AMOXICILLIN-POT CLAVULANATE 875-125 MG PO TABS
1.0000 | ORAL_TABLET | Freq: Two times a day (BID) | ORAL | 0 refills | Status: DC
Start: 2020-10-06 — End: 2020-11-05

## 2020-10-06 NOTE — Progress Notes (Signed)
Subjective:    Patient ID: Sarah Mcgee, female    DOB: 1966-03-30, 54 y.o.   MRN: [id number]   HPI: Sarah Mcgee is a 54 y.o. female presenting for burning in the chest, sore throat and HA so bad she couldn't concentrate. Still having HA, but less severe. Has had both [employer] vax, [employer]. Flu shot last week. Ears hurt and has a runny nose. No facial pain. Eyes are puffy and mattering. HA is frontal, around eyes, pressure.    Depression screen [facility] 2/9 07/30/2020 06/29/2020 06/11/2020 05/04/2020 01/21/2019  Decreased Interest 1 1 1  0 0  Down, Depressed, Hopeless 0 1 1 0 0  PHQ - 2 Score 1 2 2  0 0  Altered sleeping 0 0 3 - -  Tired, decreased energy 1 1 2  - -  Change in appetite 0 1 0 - -  Feeling bad or failure about yourself  0 0 0 - -  Trouble concentrating 1 1 2  - -  Moving slowly or fidgety/restless 1 1 1  - -  Suicidal thoughts 0 0 0 - -  PHQ-9 Score 4 6 10  - -  Difficult doing work/chores - - Somewhat difficult - -  Some recent data might be hidden     Relevant past medical, surgical, family and social history reviewed and updated as indicated.  Interim medical history since our last visit reviewed. Allergies and medications reviewed and updated.  ROS:  Review of Systems  Constitutional: Negative for activity change, appetite change, chills and fever.  HENT: Positive for congestion, postnasal drip, rhinorrhea and sinus pressure (frontal). Negative for ear discharge, ear pain, hearing loss, nosebleeds, sneezing and trouble swallowing.   Respiratory: Negative for chest tightness and shortness of breath.   Cardiovascular: Negative for chest pain and palpitations.  Skin: Negative for rash.     Social History   Tobacco Use  Smoking Status Current Every Day Smoker   Packs/day: 1.00   Types: Cigarettes   Start date: 06/29/2006  Smokeless Tobacco Never Used       Objective:     Wt Readings from Last 3 Encounters:  07/30/20 178 lb (80.7 kg)  06/29/20 175 lb 9.6 oz (79.7  kg)  06/09/20 180 lb 12.8 oz (82 kg)     Exam deferred. Pt. Harboring due to COVID 19. Phone visit performed.   Assessment & Plan:   1. Cough     Meds ordered this encounter  Medications   amoxicillin-clavulanate (AUGMENTIN) 875-125 MG tablet    Sig: Take 1 tablet by mouth 2 (two) times daily. Take all of this medication    Dispense:  20 tablet    Refill:  0    Orders Placed This Encounter  Procedures   Novel Coronavirus, NAA ([facility])    Order Specific Question:   Is this test for diagnosis or screening    Answer:   Diagnosis of ill patient    Order Specific Question:   Symptomatic for COVID-19 as defined by CDC    Answer:   Yes    Order Specific Question:   Date of Symptom Onset    Answer:   10/03/2020    Order Specific Question:   Hospitalized for COVID-19    Answer:   No    Order Specific Question:   Admitted to ICU for COVID-19    Answer:   No    Order Specific Question:   Previously tested for COVID-19    Answer:   No  Order Specific Question:   Resident in a congregate (group) care setting    Answer:   No    Order Specific Question:   Is the patient student?    Answer:   No    Order Specific Question:   Employed in healthcare setting    Answer:   No    Order Specific Question:   Pregnant    Answer:   No    Order Specific Question:   Release to patient    Answer:   Immediate      Diagnoses and all orders for this visit:  Cough -     Novel Coronavirus, NAA ([facility])  Other orders -     amoxicillin-clavulanate (AUGMENTIN) 875-125 MG tablet; Take 1 tablet by mouth 2 (two) times daily. Take all of this medication    Virtual Visit via telephone Note  I discussed the limitations, risks, security and privacy concerns of performing an evaluation and management service by telephone and the availability of in person appointments. The patient was identified with two identifiers. Pt.expressed understanding and agreed to proceed. Pt. Is at home. Dr. Darlyn Read is  in his office.  Follow Up Instructions:   I discussed the assessment and treatment plan with the patient. The patient was provided an opportunity to ask questions and all were answered. The patient agreed with the plan and demonstrated an understanding of the instructions.   The patient was advised to call back or seek an in-person evaluation if the symptoms worsen or if the condition fails to improve as anticipated.   Total minutes including chart review and phone contact time: 10   Follow up plan: Return if symptoms worsen or fail to improve.  Mechele Claude, MD [facility]

## 2020-10-07 LAB — SARS-COV-2, NAA 2 DAY TAT

## 2020-10-07 LAB — NOVEL CORONAVIRUS, NAA: SARS-CoV-2, NAA: NOT DETECTED

## 2020-10-28 ENCOUNTER — Other Ambulatory Visit: Payer: Self-pay | Admitting: Family Medicine

## 2020-10-28 DIAGNOSIS — E669 Obesity, unspecified: Secondary | ICD-10-CM

## 2020-10-28 DIAGNOSIS — F17201 Nicotine dependence, unspecified, in remission: Secondary | ICD-10-CM

## 2020-10-28 DIAGNOSIS — F411 Generalized anxiety disorder: Secondary | ICD-10-CM

## 2020-11-05 ENCOUNTER — Encounter: Payer: Self-pay | Admitting: Nurse Practitioner

## 2020-11-05 ENCOUNTER — Other Ambulatory Visit: Payer: Self-pay

## 2020-11-05 ENCOUNTER — Ambulatory Visit (INDEPENDENT_AMBULATORY_CARE_PROVIDER_SITE_OTHER): Payer: 59 | Admitting: Nurse Practitioner

## 2020-11-05 VITALS — BP 141/103 | HR 96 | Temp 97.9°F | Ht 65.0 in | Wt 178.0 lb

## 2020-11-05 DIAGNOSIS — M25542 Pain in joints of left hand: Secondary | ICD-10-CM | POA: Diagnosis not present

## 2020-11-05 DIAGNOSIS — M25541 Pain in joints of right hand: Secondary | ICD-10-CM

## 2020-11-05 MED ORDER — METHYLPREDNISOLONE ACETATE 80 MG/ML IJ SUSP
80.0000 mg | Freq: Once | INTRAMUSCULAR | Status: AC
  Administered 2020-11-05: 80 mg via INTRAMUSCULAR

## 2020-11-05 NOTE — Progress Notes (Signed)
Acute Office Visit  Subjective:    Patient ID: Sarah Mcgee, female    DOB: Jul 08, 1966, 54 y.o.   MRN: [id number]  No chief complaint on file.   Hand Pain  The incident occurred more than 1 week ago. There was no injury mechanism. The pain is present in the left wrist, left fingers, right wrist and right fingers. The quality of the pain is described as aching. The pain does not radiate. The pain is at a severity of 8/10. The pain is severe. The pain has been constant since the incident. Pertinent negatives include no numbness or tingling. Nothing aggravates the symptoms. She has tried nothing for the symptoms.     Past Medical History:  Diagnosis Date  . Anxiety   . Depression   . Hyperlipidemia     Past Surgical History:  Procedure Laterality Date  . ABDOMINAL HYSTERECTOMY    . arm surgery Right    shattered elbow  . CESAREAN SECTION      Family History  Problem Relation Age of Onset  . Hypertension Mother   . Hyperlipidemia Mother   . Breast cancer Mother 80  . Stroke Father   . Lung cancer Father   . Stroke Maternal Grandmother   . Heart attack Maternal Grandfather   . Cancer Paternal Grandmother   . Cancer Paternal Grandfather     Social History   Socioeconomic History  . Marital status: Divorced    Spouse name: Not on file  . Number of children: 2  . Years of education: Not on file  . Highest education level: Not on file  Occupational History  . Not on file  Tobacco Use  . Smoking status: Current Every Day Smoker    Packs/day: 1.00    Types: Cigarettes    Start date: 06/29/2006  . Smokeless tobacco: Never Used  Vaping Use  . Vaping Use: Former  . Quit date: 09/30/2018  Substance and Sexual Activity  . Alcohol use: Yes    Comment: occ  . Drug use: No  . Sexual activity: Not on file  Other Topics Concern  . Not on file  Social History Narrative  . Not on file   Social Determinants of Health   Financial Resource Strain:   . Difficulty of  Paying Living Expenses: Not on file  Food Insecurity:   . Worried About Programme researcher, broadcasting/film/video in the Last Year: Not on file  . Ran Out of Food in the Last Year: Not on file  Transportation Needs:   . Lack of Transportation (Medical): Not on file  . Lack of Transportation (Non-Medical): Not on file  Physical Activity:   . Days of Exercise per Week: Not on file  . Minutes of Exercise per Session: Not on file  Stress:   . Feeling of Stress : Not on file  Social Connections:   . Frequency of Communication with Friends and Family: Not on file  . Frequency of Social Gatherings with Friends and Family: Not on file  . Attends Religious Services: Not on file  . Active Member of Clubs or Organizations: Not on file  . Attends Banker Meetings: Not on file  . Marital Status: Not on file  Intimate Partner Violence:   . Fear of Current or Ex-Partner: Not on file  . Emotionally Abused: Not on file  . Physically Abused: Not on file  . Sexually Abused: Not on file    Outpatient Medications Prior to Visit  Medication  Sig Dispense Refill  . albuterol (VENTOLIN HFA) 108 (90 Base) MCG/ACT inhaler Inhale 2 puffs into the lungs every 6 (six) hours as needed for wheezing or shortness of breath. 18 g 5  . ALPRAZolam (XANAX) 0.5 MG tablet Take 1 tablet (0.5 mg total) by mouth daily as needed for anxiety. 30 tablet 1  . aspirin EC 81 MG tablet Take 1 tablet (81 mg total) by mouth daily. 30 tablet 1  . atorvastatin (LIPITOR) 40 MG tablet Take 1 tablet (40 mg total) by mouth daily. 90 tablet 1  . buPROPion (WELLBUTRIN SR) 150 MG 12 hr tablet TAKE 1 TABLET BY MOUTH TWICE A DAY 60 tablet 2  . busPIRone (BUSPAR) 15 MG tablet TAKE 1 TABLET (15 MG TOTAL) BY MOUTH 2 (TWO) TIMES DAILY. 60 tablet 0  . citalopram (CELEXA) 40 MG tablet Take 1 tablet (40 mg total) by mouth daily. 90 tablet 1  . cyclobenzaprine (FLEXERIL) 10 MG tablet Take 1 tablet (10 mg total) by mouth 3 (three) times daily as needed for  muscle spasms. 30 tablet 5  . desonide (DESOWEN) 0.05 % cream Apply topically 2 (two) times daily. 30 g 5  . gabapentin (NEURONTIN) 300 MG capsule Take 1 capsule (300 mg total) by mouth 3 (three) times daily. 90 capsule 5  . losartan (COZAAR) 50 MG tablet Take 1 tablet (50 mg total) by mouth daily after supper. 90 tablet 1  . metoprolol succinate (TOPROL XL) 25 MG 24 hr tablet Take 1 tablet (25 mg total) by mouth daily. 90 tablet 1  . phentermine (ADIPEX-P) 37.5 MG tablet Take 1 tablet (37.5 mg total) by mouth daily before breakfast. 30 tablet 0  . traZODone (DESYREL) 100 MG tablet Take 1 tablet at bedtime 90 tablet 1  . amoxicillin-clavulanate (AUGMENTIN) 875-125 MG tablet Take 1 tablet by mouth 2 (two) times daily. Take all of this medication 20 tablet 0   No facility-administered medications prior to visit.    Allergies  Allergen Reactions  . Codeine Itching and Nausea And Vomiting    Review of Systems  Musculoskeletal: Positive for arthralgias and joint swelling.  Neurological: Negative for tingling and numbness.  All other systems reviewed and are negative.      Objective:    Physical Exam Vitals reviewed.  Constitutional:      Appearance: Normal appearance.  HENT:     Head: Normocephalic.     Nose: Nose normal.  Eyes:     Conjunctiva/sclera: Conjunctivae normal.  Cardiovascular:     Rate and Rhythm: Normal rate and regular rhythm.     Pulses: Normal pulses.     Heart sounds: Normal heart sounds.  Pulmonary:     Effort: Pulmonary effort is normal.     Breath sounds: Normal breath sounds.  Musculoskeletal:        General: Swelling and tenderness present.  Skin:    General: Skin is warm.  Neurological:     Mental Status: She is alert and oriented to person, place, and time.  Psychiatric:        Behavior: Behavior normal.     BP (!) 141/103   Pulse 96   Temp 97.9 F (36.6 C)   Ht 5\' 5"  (1.651 m)   Wt 178 lb (80.7 kg)   SpO2 96%   BMI 29.62 kg/m  Wt  Readings from Last 3 Encounters:  11/05/20 178 lb (80.7 kg)  07/30/20 178 lb (80.7 kg)  06/29/20 175 lb 9.6 oz (79.7 kg)  Health Maintenance Due  Topic Date Due  . INFLUENZA VACCINE  07/25/2020    There are no preventive care reminders to display for this patient.   Lab Results  Component Value Date   TSH 1.390 02/08/2019   Lab Results  Component Value Date   WBC 11.0 (H) 06/29/2020   HGB 15.8 06/29/2020   HCT 46.4 06/29/2020   MCV 92 06/29/2020   PLT 307 06/29/2020   Lab Results  Component Value Date   NA 139 06/29/2020   K 4.6 06/29/2020   CO2 21 06/29/2020   GLUCOSE 87 06/29/2020   BUN 11 06/29/2020   CREATININE 0.78 06/29/2020   BILITOT 0.5 06/29/2020   ALKPHOS 93 06/29/2020   AST 21 06/29/2020   ALT 26 06/29/2020   PROT 6.9 06/29/2020   ALBUMIN 4.6 06/29/2020   CALCIUM 9.9 06/29/2020   Lab Results  Component Value Date   CHOL 238 (H) 06/29/2020   Lab Results  Component Value Date   HDL 51 06/29/2020   Lab Results  Component Value Date   LDLCALC 148 (H) 06/29/2020   Lab Results  Component Value Date   TRIG 213 (H) 06/29/2020   Lab Results  Component Value Date   CHOLHDL 4.7 (H) 06/29/2020   No results found for: HGBA1C     Assessment & Plan:   Problem List Items Addressed This Visit      Other   Arthralgia of both hands - Primary    Patient is reporting new symptoms of right and left finger tenderness.  Patient's knuckles are swollen, red and tender to touch.  Provided education to patient with printed handouts given.  Labs completed sed rate, arthritis panel, and C-reactive protein.  Started patient on prednisone taper, ibuprofen anti-inflammatory as needed, and Depo-Medrol shot given in office.  Follow-up with worsening or unresolved symptoms.      Relevant Orders   Arthritis Panel   Sedimentation Rate   C-reactive protein       Meds ordered this encounter  Medications  . methylPREDNISolone acetate (DEPO-MEDROL) injection  80 mg     Daryll Drown, NP

## 2020-11-05 NOTE — Assessment & Plan Note (Signed)
Patient is reporting new symptoms of right and left finger tenderness.  Patient's knuckles are swollen, red and tender to touch.  Provided education to patient with printed handouts given.  Labs completed sed rate, arthritis panel, and C-reactive protein.  Started patient on prednisone taper, ibuprofen anti-inflammatory as needed, and Depo-Medrol shot given in office.  Follow-up with worsening or unresolved symptoms.

## 2020-11-05 NOTE — Patient Instructions (Signed)
Start antiinflammatory , Aleve, steroid taper, Depo-meth shot, follow up as needed, RX sent to [facility]  Arthritis Arthritis means joint pain. It can also mean joint disease. A joint is a place where bones come together. There are more than 100 types of arthritis. What are the causes? This condition may be caused by:  Wear and tear of a joint. This is the most common cause.  A lot of acid in the blood, which leads to pain in the joint (gout).  Pain and swelling (inflammation) in a joint.  Infection of a joint.  Injuries in the joint.  A reaction to medicines (allergy). In some cases, the cause may not be known. What are the signs or symptoms? Symptoms of this condition include:  Redness at a joint.  Swelling at a joint.  Stiffness at a joint.  Warmth coming from the joint.  A fever.  A feeling of being sick. How is this treated? This condition may be treated with:  Treating the cause, if it is known.  Rest.  Raising (elevating) the joint.  Putting cold or hot packs on the joint.  Medicines to treat symptoms and reduce pain and swelling.  Shots of medicines (cortisone) into the joint. You may also be told to make changes in your life, such as doing exercises and losing weight. Follow these instructions at home: Medicines  Take over-the-counter and prescription medicines only as told by your doctor.  Do not take aspirin for pain if your doctor says that you may have gout. Activity  Rest your joint if your doctor tells you to.  Avoid activities that make the pain worse.  Exercise your joint regularly as told by your doctor. Try doing exercises like: ? Swimming. ? Water aerobics. ? Biking. ? Walking. Managing pain, stiffness, and swelling      If told, put ice on the affected area. ? Put ice in a plastic bag. ? Place a towel between your skin and the bag. ? Leave the ice on for 20 minutes, 2-3 times per day.  If your joint is swollen, raise  (elevate) it above the level of your heart if told by your doctor.  If your joint feels stiff in the morning, try taking a warm shower.  If told, put heat on the affected area. Do this as often as told by your doctor. Use the heat source that your doctor recommends, such as a moist heat pack or a heating pad. If you have diabetes, do not apply heat without asking your doctor. To apply heat: ? Place a towel between your skin and the heat source. ? Leave the heat on for 20-30 minutes. ? Remove the heat if your skin turns bright red. This is very important if you are unable to feel pain, heat, or cold. You may have a greater risk of getting burned. General instructions  Do not use any products that contain nicotine or tobacco, such as cigarettes, e-cigarettes, and chewing tobacco. If you need help quitting, ask your doctor.  Keep all follow-up visits as told by your doctor. This is important. Contact a doctor if:  The pain gets worse.  You have a fever. Get help right away if:  You have very bad pain in your joint.  You have swelling in your joint.  Your joint is red.  Many joints become painful and swollen.  You have very bad back pain.  Your leg is very weak.  You cannot control your pee (urine) or poop (stool).  Summary  Arthritis means joint pain. It can also mean joint disease. A joint is a place where bones come together.  The most common cause of this condition is wear and tear of a joint.  Symptoms of this condition include redness, swelling, or stiffness of the joint.  This condition is treated with rest, raising the joint, medicines, and putting cold or hot packs on the joint.  Follow your doctor's instructions about medicines, activity, exercises, and other home care treatments. This information is not intended to replace advice given to you by your health care provider. Make sure you discuss any questions you have with your health care provider. Document Revised:  11/18/2018 Document Reviewed: 11/18/2018 Elsevier Patient Education  2020 [employer].

## 2020-11-06 LAB — ARTHRITIS PANEL
Basophils Absolute: 0.1 10*3/uL (ref 0.0–0.2)
Basos: 1 %
EOS (ABSOLUTE): 0.4 10*3/uL (ref 0.0–0.4)
Eos: 3 %
Hematocrit: 46 % (ref 34.0–46.6)
Hemoglobin: 15.6 g/dL (ref 11.1–15.9)
Immature Grans (Abs): 0.1 10*3/uL (ref 0.0–0.1)
Immature Granulocytes: 1 %
Lymphocytes Absolute: 4.2 10*3/uL — ABNORMAL HIGH (ref 0.7–3.1)
Lymphs: 32 %
MCH: 31.6 pg (ref 26.6–33.0)
MCHC: 33.9 g/dL (ref 31.5–35.7)
MCV: 93 fL (ref 79–97)
Monocytes Absolute: 0.8 10*3/uL (ref 0.1–0.9)
Monocytes: 6 %
Neutrophils Absolute: 7.6 10*3/uL — ABNORMAL HIGH (ref 1.4–7.0)
Neutrophils: 57 %
Platelets: 320 10*3/uL (ref 150–450)
RBC: 4.94 x10E6/uL (ref 3.77–5.28)
RDW: 12.9 % (ref 11.7–15.4)
Rheumatoid fact SerPl-aCnc: <10 IU/mL (ref 0.0–13.9)
Sed Rate: 2 mm/hr (ref 0–40)
Uric Acid: 5.7 mg/dL (ref 3.0–7.2)
WBC: 13.1 10*3/uL — ABNORMAL HIGH (ref 3.4–10.8)

## 2020-11-06 LAB — C-REACTIVE PROTEIN: CRP: 10 mg/L (ref 0–10)

## 2020-11-06 MED ORDER — PREDNISONE 10 MG (21) PO TBPK: ORAL_TABLET | ORAL | 0 refills | Status: DC

## 2020-11-06 NOTE — Addendum Note (Signed)
Addended by: Jannifer Rodney A on: 11/06/2020 01:38 PM   Modules accepted: Orders

## 2020-11-08 ENCOUNTER — Telehealth: Payer: Self-pay | Admitting: Family Medicine

## 2020-11-08 ENCOUNTER — Other Ambulatory Visit: Payer: Self-pay | Admitting: Nurse Practitioner

## 2020-11-08 DIAGNOSIS — M25541 Pain in joints of right hand: Secondary | ICD-10-CM

## 2020-11-08 NOTE — Telephone Encounter (Signed)
Pt wants to speak with a nurse further about abnormal labs

## 2020-11-08 NOTE — Telephone Encounter (Signed)
Lmtcb.

## 2020-11-09 NOTE — Telephone Encounter (Signed)
Left message to call back.  Spoke with Tesh in lab, ANA panel was added yesterday per Je's request, waiting on results.  See Je's comments on other results.

## 2020-11-09 NOTE — Telephone Encounter (Signed)
Pt aware of comments by Nurse Jan yesterday and voiced understanding and reviewed the lab results.

## 2020-11-10 LAB — SPECIMEN STATUS REPORT

## 2020-11-10 LAB — ANA COMPREHENSIVE PANEL
Anti JO-1: <0.2 AI (ref 0.0–0.9)
Centromere Ab Screen: <0.2 AI (ref 0.0–0.9)
Chromatin Ab SerPl-aCnc: <0.2 AI (ref 0.0–0.9)
ENA RNP Ab: 0.2 AI (ref 0.0–0.9)
ENA SM Ab Ser-aCnc: <0.2 AI (ref 0.0–0.9)
ENA SSA (RO) Ab: <0.2 AI (ref 0.0–0.9)
ENA SSB (LA) Ab: <0.2 AI (ref 0.0–0.9)
Scleroderma (Scl-70) (ENA) Antibody, IgG: <0.2 AI (ref 0.0–0.9)
dsDNA Ab: <1 IU/mL (ref 0–9)

## 2020-11-12 ENCOUNTER — Other Ambulatory Visit: Payer: Self-pay | Admitting: Nurse Practitioner

## 2020-11-12 ENCOUNTER — Telehealth: Payer: Self-pay

## 2020-11-12 MED ORDER — MELOXICAM 7.5 MG PO TABS: 7.5000 mg | ORAL_TABLET | Freq: Every day | ORAL | 1 refills | Status: DC

## 2020-11-12 MED ORDER — DICLOFENAC SODIUM 1 % EX GEL: 2.0000 g | Freq: Four times a day (QID) | CUTANEOUS | 1 refills | Status: AC

## 2020-11-12 NOTE — Telephone Encounter (Signed)
Patient aware and verbalized understanding. °

## 2020-11-12 NOTE — Telephone Encounter (Signed)
I sent a referral to orthopedic [facility]], after consultation we may have a different pain management for her.  Next pain management will be narcotics which will usually not help with this type of pain.  I will send Voltaren gel and Meloxicam (a little better than Ibuprofen) to the pharmacy, after orthopedic and pain is still not well managed, then we can schedule another office visit.

## 2020-11-12 NOTE — Telephone Encounter (Signed)
Patient states that ibuprofen and advil is not helping with the pain and is at a stand still.  Patient would like to know if there is anything different she can take or do to help with the pain.

## 2020-11-28 ENCOUNTER — Other Ambulatory Visit: Payer: Self-pay | Admitting: Family Medicine

## 2020-11-28 DIAGNOSIS — F411 Generalized anxiety disorder: Secondary | ICD-10-CM

## 2021-01-03 ENCOUNTER — Other Ambulatory Visit: Payer: Self-pay | Admitting: Family Medicine

## 2021-01-04 ENCOUNTER — Other Ambulatory Visit: Payer: Self-pay | Admitting: Family Medicine

## 2021-01-04 DIAGNOSIS — I10 Essential (primary) hypertension: Secondary | ICD-10-CM

## 2021-01-04 DIAGNOSIS — F411 Generalized anxiety disorder: Secondary | ICD-10-CM

## 2021-01-04 NOTE — Telephone Encounter (Signed)
Britney. NTBS 30 days given 11/29/20

## 2021-01-05 ENCOUNTER — Other Ambulatory Visit: Payer: Self-pay | Admitting: Family Medicine

## 2021-01-05 DIAGNOSIS — I10 Essential (primary) hypertension: Secondary | ICD-10-CM

## 2021-01-05 NOTE — Telephone Encounter (Signed)
Patient aware to schedule appointment.

## 2021-02-05 ENCOUNTER — Other Ambulatory Visit: Payer: Self-pay | Admitting: Family Medicine

## 2021-02-05 DIAGNOSIS — F17201 Nicotine dependence, unspecified, in remission: Secondary | ICD-10-CM

## 2021-02-05 DIAGNOSIS — F411 Generalized anxiety disorder: Secondary | ICD-10-CM

## 2021-02-05 DIAGNOSIS — E669 Obesity, unspecified: Secondary | ICD-10-CM

## 2021-02-07 ENCOUNTER — Other Ambulatory Visit: Payer: Self-pay | Admitting: *Deleted

## 2021-02-07 DIAGNOSIS — I10 Essential (primary) hypertension: Secondary | ICD-10-CM

## 2021-02-07 MED ORDER — LOSARTAN POTASSIUM 50 MG PO TABS: 50.0000 mg | ORAL_TABLET | Freq: Every day | ORAL | 0 refills | Status: DC

## 2021-02-09 ENCOUNTER — Other Ambulatory Visit: Payer: Self-pay | Admitting: Family Medicine

## 2021-02-09 DIAGNOSIS — F331 Major depressive disorder, recurrent, moderate: Secondary | ICD-10-CM

## 2021-02-09 DIAGNOSIS — F411 Generalized anxiety disorder: Secondary | ICD-10-CM

## 2021-02-09 DIAGNOSIS — G479 Sleep disorder, unspecified: Secondary | ICD-10-CM

## 2021-02-28 ENCOUNTER — Encounter: Payer: Self-pay | Admitting: Family Medicine

## 2021-02-28 ENCOUNTER — Ambulatory Visit (INDEPENDENT_AMBULATORY_CARE_PROVIDER_SITE_OTHER): Payer: 59 | Admitting: Family Medicine

## 2021-02-28 ENCOUNTER — Other Ambulatory Visit: Payer: Self-pay

## 2021-02-28 VITALS — BP 118/82 | HR 102 | Temp 98.0°F | Ht 65.0 in | Wt 171.8 lb

## 2021-02-28 DIAGNOSIS — M2550 Pain in unspecified joint: Secondary | ICD-10-CM

## 2021-02-28 DIAGNOSIS — F411 Generalized anxiety disorder: Secondary | ICD-10-CM

## 2021-02-28 DIAGNOSIS — R61 Generalized hyperhidrosis: Secondary | ICD-10-CM

## 2021-02-28 DIAGNOSIS — E782 Mixed hyperlipidemia: Secondary | ICD-10-CM

## 2021-02-28 DIAGNOSIS — I10 Essential (primary) hypertension: Secondary | ICD-10-CM

## 2021-02-28 DIAGNOSIS — E669 Obesity, unspecified: Secondary | ICD-10-CM

## 2021-02-28 DIAGNOSIS — G479 Sleep disorder, unspecified: Secondary | ICD-10-CM

## 2021-02-28 DIAGNOSIS — F331 Major depressive disorder, recurrent, moderate: Secondary | ICD-10-CM

## 2021-02-28 DIAGNOSIS — F17201 Nicotine dependence, unspecified, in remission: Secondary | ICD-10-CM | POA: Diagnosis not present

## 2021-02-28 DIAGNOSIS — M5441 Lumbago with sciatica, right side: Secondary | ICD-10-CM

## 2021-02-28 DIAGNOSIS — M5442 Lumbago with sciatica, left side: Secondary | ICD-10-CM

## 2021-02-28 MED ORDER — METOPROLOL SUCCINATE ER 25 MG PO TB24: 25.0000 mg | ORAL_TABLET | Freq: Every day | ORAL | 1 refills | Status: AC

## 2021-02-28 MED ORDER — ATORVASTATIN CALCIUM 40 MG PO TABS
40.0000 mg | ORAL_TABLET | Freq: Every day | ORAL | 1 refills | Status: AC
Start: 2021-02-28 — End: ?

## 2021-02-28 MED ORDER — ALPRAZOLAM 0.5 MG PO TABS: 0.5000 mg | ORAL_TABLET | Freq: Every day | ORAL | 1 refills | Status: AC | PRN

## 2021-02-28 MED ORDER — GABAPENTIN 300 MG PO CAPS: 300.0000 mg | ORAL_CAPSULE | Freq: Three times a day (TID) | ORAL | 5 refills | Status: AC

## 2021-02-28 MED ORDER — BUPROPION HCL ER (SR) 150 MG PO TB12: ORAL_TABLET | ORAL | 0 refills | Status: AC

## 2021-02-28 MED ORDER — BUSPIRONE HCL 15 MG PO TABS: 15.0000 mg | ORAL_TABLET | Freq: Two times a day (BID) | ORAL | 1 refills | Status: AC

## 2021-02-28 MED ORDER — CYCLOBENZAPRINE HCL 10 MG PO TABS: 10.0000 mg | ORAL_TABLET | Freq: Three times a day (TID) | ORAL | 5 refills | Status: AC | PRN

## 2021-02-28 MED ORDER — TRAZODONE HCL 100 MG PO TABS: 200.0000 mg | ORAL_TABLET | Freq: Every day | ORAL | 1 refills | Status: AC

## 2021-02-28 MED ORDER — CITALOPRAM HYDROBROMIDE 40 MG PO TABS: 40.0000 mg | ORAL_TABLET | Freq: Every day | ORAL | 1 refills | Status: AC

## 2021-02-28 MED ORDER — LOSARTAN POTASSIUM 50 MG PO TABS: 50.0000 mg | ORAL_TABLET | Freq: Every day | ORAL | 1 refills | Status: AC

## 2021-02-28 MED ORDER — MELOXICAM 7.5 MG PO TABS: 7.5000 mg | ORAL_TABLET | Freq: Every day | ORAL | 1 refills | Status: AC

## 2021-02-28 NOTE — Patient Instructions (Addendum)
Decrease Wellbutrin to once daily x1 week, then every other day x1 week, then stop.   Please return fasting for your lab work.   Mindfulness-Based Stress Reduction Mindfulness-based stress reduction (MBSR) is a program that helps people learn to practice mindfulness. Mindfulness is the practice of intentionally paying attention to the present moment. MBSR focuses on developing self-awareness, which allows you to respond to life stress without judgment or negative emotions. It can be learned and practiced through techniques such as education, breathing exercises, meditation, and yoga. MBSR includes several mindfulness techniques in one program. MBSR works best when you understand the treatment, are willing to try new things, and can commit to spending time practicing what you learn. MBSR training may include learning about:  How your emotions, thoughts, and reactions affect your body.  New ways to respond to things that cause negative thoughts to start (triggers).  How to notice your thoughts and let go of them.  Practicing awareness of everyday things that you normally do without thinking.  The techniques and goals of different types of meditation. What are the benefits of MBSR? MBSR can have many benefits, which include helping you to:  Develop self-awareness. This refers to knowing and understanding yourself.  Learn skills and attitudes that help you to participate in your own health care.  Learn new ways to care for yourself.  Be more accepting about how things are, and let things go.  Be less judgmental and approach things with an open mind.  Be patient with yourself and trust yourself more. MBSR has also been shown to:  Reduce negative emotions, such as depression and anxiety.  Improve memory and focus.  Change how you sense and approach pain.  Boost your body's ability to fight infections.  Help you connect better with other people.  Improve your sense of  well-being. Follow these instructions at home:  Find a local in-person or online MBSR program.  Set aside some time regularly for mindfulness practice.  Find a mindfulness practice that works best for you. This may include one or more of the following: ? Meditation. Meditation involves focusing your mind on a certain thought or activity. ? Breathing awareness exercises. These help you to stay present by focusing on your breath. ? Body scan. For this practice, you lie down and pay attention to each part of your body from head to toe. You can identify tension and soreness and intentionally relax parts of your body. ? Yoga. Yoga involves stretching and breathing, and it can improve your ability to move and be flexible. It can also provide an experience of testing your body's limits, which can help you release stress. ? Mindful eating. This way of eating involves focusing on the taste, texture, color, and smell of each bite of food. Because this slows down eating and helps you feel full sooner, it can be an important part of a weight-loss plan.  Find a podcast or recording that provides guidance for breathing awareness, body scan, or meditation exercises. You can listen to these any time when you have a free moment to rest without distractions.  Follow your treatment plan as told by your health care provider. This may include taking regular medicines and making changes to your diet or lifestyle as recommended.   How to practice mindfulness To do a basic awareness exercise:  Find a comfortable place to sit.  Pay attention to the present moment. Observe your thoughts, feelings, and surroundings just as they are.  Avoid placing judgment  on yourself, your feelings, or your surroundings. Make note of any judgment that comes up, and let it go.  Your mind may wander, and that is okay. Make note of when your thoughts drift, and return your attention to the present moment. To do basic mindfulness  meditation:  Find a comfortable place to sit. This may include a stable chair or a firm floor cushion. ? Sit upright with your back straight. Let your arms fall next to your side with your hands resting on your legs. ? If sitting in a chair, rest your feet flat on the floor. ? If sitting on a cushion, cross your legs in front of you.  Keep your head in a neutral position with your chin dropped slightly. Relax your jaw and rest the tip of your tongue on the roof of your mouth. Drop your gaze to the floor. You can close your eyes if you like.  Breathe normally and pay attention to your breath. Feel the air moving in and out of your nose. Feel your belly expanding and relaxing with each breath.  Your mind may wander, and that is okay. Make note of when your thoughts drift, and return your attention to your breath.  Avoid placing judgment on yourself, your feelings, or your surroundings. Make note of any judgment or feelings that come up, let them go, and bring your attention back to your breath.  When you are ready, lift your gaze or open your eyes. Pay attention to how your body feels after the meditation. Where to find more information You can find more information about MBSR from:  Your health care provider.  Community-based meditation centers or programs.  Programs offered near you. Summary  Mindfulness-based stress reduction (MBSR) is a program that teaches you how to intentionally pay attention to the present moment. It is used with other treatments to help you cope better with daily stress, emotions, and pain.  MBSR focuses on developing self-awareness, which allows you to respond to life stress without judgment or negative emotions.  MBSR programs may involve learning different mindfulness practices, such as breathing exercises, meditation, yoga, body scan, or mindful eating. Find a mindfulness practice that works best for you, and set aside time for it on a regular basis. This  information is not intended to replace advice given to you by your health care provider. Make sure you discuss any questions you have with your health care provider. Document Revised: 08/27/2020 Document Reviewed: 08/27/2020 Elsevier Patient Education  2021 [employer].

## 2021-02-28 NOTE — Progress Notes (Signed)
Assessment & Plan:  1. Essential hypertension Well controlled on current regimen.  - losartan (COZAAR) 50 MG tablet; Take 1 tablet (50 mg total) by mouth daily after supper.  Dispense: 90 tablet; Refill: 1 - metoprolol succinate (TOPROL-XL) 25 MG 24 hr tablet; Take 1 tablet (25 mg total) by mouth daily.  Dispense: 90 tablet; Refill: 1 - CBC with Differential/Platelet; Future - CMP14+EGFR; Future - Lipid panel; Future  2. Mixed hyperlipidemia Labs to assess. - atorvastatin (LIPITOR) 40 MG tablet; Take 1 tablet (40 mg total) by mouth daily.  Dispense: 90 tablet; Refill: 1 - CMP14+EGFR; Future - Lipid panel; Future  3. Tobacco use disorder, moderate, in early remission Smoking cessation encouraged. - CBC with Differential/Platelet; Future - CMP14+EGFR; Future - Lipid panel; Future  4. Moderate episode of recurrent major depressive disorder (HCC) Uncontrolled. Weaning off Wellbutrin - decrease to QD x1 week, then QOD x1 week, then stop. Increased Trazodone from 100 mg to 200 mg QHS.  - traZODone (DESYREL) 100 MG tablet; Take 2 tablets (200 mg total) by mouth at bedtime.  Dispense: 180 tablet; Refill: 1 - citalopram (CELEXA) 40 MG tablet; Take 1 tablet (40 mg total) by mouth daily.  Dispense: 90 tablet; Refill: 1 - CMP14+EGFR; Future  5. Generalized anxiety disorder Uncontrolled. Weaning off Wellbutrin - decrease to QD x1 week, then QOD x1 week, then stop. Increased Trazodone from 100 mg to 200 mg QHS. Chest discomfort episode felt to be related to anxiety.  - traZODone (DESYREL) 100 MG tablet; Take 2 tablets (200 mg total) by mouth at bedtime.  Dispense: 180 tablet; Refill: 1 - ALPRAZolam (XANAX) 0.5 MG tablet; Take 1 tablet (0.5 mg total) by mouth daily as needed for anxiety.  Dispense: 30 tablet; Refill: 1 - busPIRone (BUSPAR) 15 MG tablet; Take 1 tablet (15 mg total) by mouth 2 (two) times daily.  Dispense: 180 tablet; Refill: 1 - citalopram (CELEXA) 40 MG tablet; Take 1 tablet (40  mg total) by mouth daily.  Dispense: 90 tablet; Refill: 1 - CMP14+EGFR; Future  6. Difficulty sleeping Well controlled on current regimen.  - traZODone (DESYREL) 100 MG tablet; Take 2 tablets (200 mg total) by mouth at bedtime.  Dispense: 180 tablet; Refill: 1 - CMP14+EGFR; Future  7. Acute midline low back pain with bilateral sciatica Well controlled on current regimen.  - cyclobenzaprine (FLEXERIL) 10 MG tablet; Take 1 tablet (10 mg total) by mouth 3 (three) times daily as needed for muscle spasms.  Dispense: 30 tablet; Refill: 5 - CMP14+EGFR; Future  8. Polyarthralgia Well controlled on current regimen.  - gabapentin (NEURONTIN) 300 MG capsule; Take 1 capsule (300 mg total) by mouth 3 (three) times daily.  Dispense: 90 capsule; Refill: 5 - meloxicam (MOBIC) 7.5 MG tablet; Take 1 tablet (7.5 mg total) by mouth daily.  Dispense: 90 tablet; Refill: 1 - CMP14+EGFR; Future  9. Night sweats Lab workup.  - CMP14+EGFR; Future - Thyroid Panel With TSH; Future - Prolactin; Future - FSH/LH; Future  10. Healthcare maintenance Patient to schedule mammogram on the bus. She will do colonoscopy later; does not wish to do cologuard.    Return in about 6 weeks (around 04/11/2021) for Mood.  Hendricks Limes, MSN, APRN, FNP-C [facility]  Subjective:    Patient ID: Sarah Mcgee, female    DOB: 25-Nov-1966, 55 y.o.   MRN: [id number]  Patient Care Team: Loman Brooklyn, FNP as PCP - General (Family Medicine)   Chief Complaint:  Chief Complaint  Patient  presents with  . Anxiety  . Hypertension    Check up of chronic medical conditions   . Chest Pain    Patient states she had chest pain lastnight that had her in tears.  States it lasted an hour and she no longer has it. Has no energy today.    . Night Sweats    Patient states it has been going on a few months.    HPI: Sarah Mcgee is a 55 y.o. female presenting on 02/28/2021 for Anxiety, Hypertension (Check up of  chronic medical conditions/), Chest Pain (Patient states she had chest pain lastnight that had her in tears.  States it lasted an hour and she no longer has it. Has no energy today. /), and Night Sweats (Patient states it has been going on a few months.)  Hypertension: patient does not check BP at home.   Tobacco Use: patient was giving Wellbutrin last year to aid in smoking cessation. It has not worked. She is still smoking a pack per day.  Weight: patient has been trying to lose weight. She previously took Adipex but did not lose any weight with it. She is down 7 lbs since I last saw her last year.   Anxiety/Depression: patient feels her mood is getting worse and her mood swings are all over the place. She feels she is either crying or infuriated. She is having increased amounts of stress at work as well. She was started on Wellbutrin last year to help quit smoking and help with her mood but she states it did not help at all. She has a lot going on with her fiance as well.   New complaints: Patient reports an episode of chest pain last night that occurred in the middle of her chest. The pain lasted about an hour. Pushing on the area and the weight of her boob if she was side lying made the pain worse. Denies any acid reflux, palpitations, or shortness of breath.  She also reports night sweats around 2 AM recently that occur on a nightly basis.   Social history:  Relevant past medical, surgical, family and social history reviewed and updated as indicated. Interim medical history since our last visit reviewed.  Allergies and medications reviewed and updated.  DATA REVIEWED: CHART IN EPIC  ROS: Negative unless specifically indicated above in HPI.    Current Outpatient Medications:  .  albuterol (VENTOLIN HFA) 108 (90 Base) MCG/ACT inhaler, Inhale 2 puffs into the lungs every 6 (six) hours as needed for wheezing or shortness of breath., Disp: 18 g, Rfl: 5 .  ALPRAZolam (XANAX) 0.5 MG  tablet, Take 1 tablet (0.5 mg total) by mouth daily as needed for anxiety., Disp: 30 tablet, Rfl: 1 .  aspirin EC 81 MG tablet, Take 1 tablet (81 mg total) by mouth daily., Disp: 30 tablet, Rfl: 1 .  atorvastatin (LIPITOR) 40 MG tablet, Take 1 tablet (40 mg total) by mouth daily., Disp: 90 tablet, Rfl: 1 .  buPROPion (WELLBUTRIN SR) 150 MG 12 hr tablet, Take 1 tablet (150 mg total) by mouth 2 (two) times daily. (Needs to be seen before next refill), Disp: 60 tablet, Rfl: 0 .  busPIRone (BUSPAR) 15 MG tablet, Take 1 tablet (15 mg total) by mouth 2 (two) times daily. (Needs to be seen before next refill), Disp: 60 tablet, Rfl: 0 .  citalopram (CELEXA) 40 MG tablet, Take 1 tablet (40 mg total) by mouth daily., Disp: 90 tablet, Rfl: 1 .  cyclobenzaprine (FLEXERIL)  10 MG tablet, Take 1 tablet (10 mg total) by mouth 3 (three) times daily as needed for muscle spasms., Disp: 30 tablet, Rfl: 5 .  desonide (DESOWEN) 0.05 % cream, Apply topically 2 (two) times daily., Disp: 30 g, Rfl: 5 .  diclofenac Sodium (VOLTAREN) 1 % GEL, Apply 2 g topically 4 (four) times daily., Disp: 50 g, Rfl: 1 .  gabapentin (NEURONTIN) 300 MG capsule, Take 1 capsule (300 mg total) by mouth 3 (three) times daily., Disp: 90 capsule, Rfl: 5 .  losartan (COZAAR) 50 MG tablet, Take 1 tablet (50 mg total) by mouth daily after supper. (Needs to be seen before next refill), Disp: 30 tablet, Rfl: 0 .  meloxicam (MOBIC) 7.5 MG tablet, TAKE 1 TABLET BY MOUTH EVERY DAY, Disp: 30 tablet, Rfl: 0 .  metoprolol succinate (TOPROL-XL) 25 MG 24 hr tablet, Take 1 tablet (25 mg total) by mouth daily. (Needs to be seen before next refill), Disp: 30 tablet, Rfl: 0 .  phentermine (ADIPEX-P) 37.5 MG tablet, Take 1 tablet (37.5 mg total) by mouth daily before breakfast., Disp: 30 tablet, Rfl: 0 .  traZODone (DESYREL) 100 MG tablet, Take 1 tablet at bedtime, Disp: 90 tablet, Rfl: 1   Allergies  Allergen Reactions  . Codeine Itching and Nausea And Vomiting    Past Medical History:  Diagnosis Date  . Anxiety   . Depression   . Hyperlipidemia     Past Surgical History:  Procedure Laterality Date  . ABDOMINAL HYSTERECTOMY    . arm surgery Right    shattered elbow  . CESAREAN SECTION      Social History   Socioeconomic History  . Marital status: Divorced    Spouse name: Not on file  . Number of children: 2  . Years of education: Not on file  . Highest education level: Not on file  Occupational History  . Not on file  Tobacco Use  . Smoking status: Current Every Day Smoker    Packs/day: 1.00    Types: Cigarettes    Start date: 06/29/2006  . Smokeless tobacco: Never Used  Vaping Use  . Vaping Use: Former  . Quit date: 09/30/2018  Substance and Sexual Activity  . Alcohol use: Yes    Comment: occ  . Drug use: No  . Sexual activity: Not on file  Other Topics Concern  . Not on file  Social History Narrative  . Not on file   Social Determinants of Health   Financial Resource Strain: Not on file  Food Insecurity: Not on file  Transportation Needs: Not on file  Physical Activity: Not on file  Stress: Not on file  Social Connections: Not on file  Intimate Partner Violence: Not on file        Objective:    BP 118/82 Comment: manual  Pulse (!) 102   Temp 98 F (36.7 C) (Temporal)   Ht 5' 5"  (1.651 m)   Wt 171 lb 12.8 oz (77.9 kg)   SpO2 97%   BMI 28.59 kg/m   Wt Readings from Last 3 Encounters:  02/28/21 171 lb 12.8 oz (77.9 kg)  11/05/20 178 lb (80.7 kg)  07/30/20 178 lb (80.7 kg)    Physical Exam Vitals reviewed.  Constitutional:      General: She is not in acute distress.    Appearance: Normal appearance. She is overweight. She is not ill-appearing, toxic-appearing or diaphoretic.  HENT:     Head: Normocephalic and atraumatic.  Eyes:  General: No scleral icterus.       Right eye: No discharge.        Left eye: No discharge.     Conjunctiva/sclera: Conjunctivae normal.  Cardiovascular:     Rate  and Rhythm: Normal rate and regular rhythm.     Heart sounds: Normal heart sounds. No murmur heard. No friction rub. No gallop.   Pulmonary:     Effort: Pulmonary effort is normal. No respiratory distress.     Breath sounds: Normal breath sounds. No stridor. No wheezing, rhonchi or rales.  Musculoskeletal:        General: Normal range of motion.     Cervical back: Normal range of motion.  Skin:    General: Skin is warm and dry.     Capillary Refill: Capillary refill takes less than 2 seconds.  Neurological:     General: No focal deficit present.     Mental Status: She is alert and oriented to person, place, and time. Mental status is at baseline.  Psychiatric:        Mood and Affect: Mood normal. Affect is tearful.        Behavior: Behavior normal.        Thought Content: Thought content normal.        Judgment: Judgment normal.     Lab Results  Component Value Date   TSH 1.390 02/08/2019   Lab Results  Component Value Date   WBC 13.1 (H) 11/05/2020   HGB 15.6 11/05/2020   HCT 46.0 11/05/2020   MCV 93 11/05/2020   PLT 320 11/05/2020   Lab Results  Component Value Date   NA 139 06/29/2020   K 4.6 06/29/2020   CO2 21 06/29/2020   GLUCOSE 87 06/29/2020   BUN 11 06/29/2020   CREATININE 0.78 06/29/2020   BILITOT 0.5 06/29/2020   ALKPHOS 93 06/29/2020   AST 21 06/29/2020   ALT 26 06/29/2020   PROT 6.9 06/29/2020   ALBUMIN 4.6 06/29/2020   CALCIUM 9.9 06/29/2020   Lab Results  Component Value Date   CHOL 238 (H) 06/29/2020   Lab Results  Component Value Date   HDL 51 06/29/2020   Lab Results  Component Value Date   LDLCALC 148 (H) 06/29/2020   Lab Results  Component Value Date   TRIG 213 (H) 06/29/2020   Lab Results  Component Value Date   CHOLHDL 4.7 (H) 06/29/2020   No results found for: HGBA1C

## 2021-03-28 ENCOUNTER — Encounter: Payer: Self-pay | Admitting: Family Medicine

## 2021-04-13 ENCOUNTER — Ambulatory Visit: Payer: 59 | Admitting: Family Medicine

## 2021-04-14 ENCOUNTER — Encounter: Payer: Self-pay | Admitting: Family Medicine

## 2021-04-25 ENCOUNTER — Ambulatory Visit: Payer: 59 | Admitting: Family Medicine

## 2021-04-26 ENCOUNTER — Other Ambulatory Visit: Payer: Self-pay | Admitting: Family Medicine

## 2021-04-26 DIAGNOSIS — Z1231 Encounter for screening mammogram for malignant neoplasm of breast: Secondary | ICD-10-CM

## 2021-04-29 ENCOUNTER — Encounter: Payer: Self-pay | Admitting: Family Medicine

## 2021-07-01 ENCOUNTER — Encounter: Payer: 59 | Admitting: Family Medicine

## 2022-04-05 ENCOUNTER — Other Ambulatory Visit: Payer: Self-pay | Admitting: Family Medicine

## 2022-04-05 DIAGNOSIS — F411 Generalized anxiety disorder: Secondary | ICD-10-CM

## 2022-04-05 DIAGNOSIS — F331 Major depressive disorder, recurrent, moderate: Secondary | ICD-10-CM
